# Patient Record
Sex: Male | Born: 1935 | Race: White | Hispanic: No | Marital: Married | Smoking: Former smoker
Health system: Southern US, Community
[De-identification: ages and names within clinical notes are randomized; demographics above are authoritative.]

## PROBLEM LIST (undated history)

## (undated) DIAGNOSIS — C801 Malignant (primary) neoplasm, unspecified: Secondary | ICD-10-CM

## (undated) DIAGNOSIS — I10 Essential (primary) hypertension: Secondary | ICD-10-CM

## (undated) DIAGNOSIS — I509 Heart failure, unspecified: Secondary | ICD-10-CM

## (undated) HISTORY — PX: BACK SURGERY: SHX140

## (undated) HISTORY — PX: LUNG SURGERY: SHX703

---

## 2011-03-22 ENCOUNTER — Inpatient Hospital Stay (HOSPITAL_COMMUNITY)
Admission: EM | Admit: 2011-03-22 | Discharge: 2011-03-23 | Disposition: A | Discharge status: Home / Self Care | Payer: Medicare Other | Source: Home / Self Care | Attending: Internal Medicine | Admitting: Internal Medicine

## 2011-03-22 ENCOUNTER — Emergency Department (HOSPITAL_COMMUNITY): Payer: Medicare Other

## 2011-03-22 DIAGNOSIS — N289 Disorder of kidney and ureter, unspecified: Secondary | ICD-10-CM | POA: Diagnosis not present

## 2011-03-22 DIAGNOSIS — J9620 Acute and chronic respiratory failure, unspecified whether with hypoxia or hypercapnia: Secondary | ICD-10-CM | POA: Diagnosis present

## 2011-03-22 DIAGNOSIS — I2510 Atherosclerotic heart disease of native coronary artery without angina pectoris: Secondary | ICD-10-CM | POA: Diagnosis present

## 2011-03-22 DIAGNOSIS — E119 Type 2 diabetes mellitus without complications: Secondary | ICD-10-CM | POA: Diagnosis present

## 2011-03-22 DIAGNOSIS — I214 Non-ST elevation (NSTEMI) myocardial infarction: Secondary | ICD-10-CM | POA: Diagnosis present

## 2011-03-22 DIAGNOSIS — I2582 Chronic total occlusion of coronary artery: Secondary | ICD-10-CM | POA: Diagnosis present

## 2011-03-22 DIAGNOSIS — I129 Hypertensive chronic kidney disease with stage 1 through stage 4 chronic kidney disease, or unspecified chronic kidney disease: Secondary | ICD-10-CM | POA: Diagnosis present

## 2011-03-22 DIAGNOSIS — Z79899 Other long term (current) drug therapy: Secondary | ICD-10-CM

## 2011-03-22 DIAGNOSIS — Z7982 Long term (current) use of aspirin: Secondary | ICD-10-CM

## 2011-03-22 DIAGNOSIS — F17200 Nicotine dependence, unspecified, uncomplicated: Secondary | ICD-10-CM | POA: Diagnosis present

## 2011-03-22 DIAGNOSIS — I509 Heart failure, unspecified: Secondary | ICD-10-CM | POA: Diagnosis not present

## 2011-03-22 DIAGNOSIS — N189 Chronic kidney disease, unspecified: Secondary | ICD-10-CM | POA: Diagnosis present

## 2011-03-22 DIAGNOSIS — E7800 Pure hypercholesterolemia, unspecified: Secondary | ICD-10-CM | POA: Diagnosis present

## 2011-03-22 DIAGNOSIS — Z8611 Personal history of tuberculosis: Secondary | ICD-10-CM

## 2011-03-22 DIAGNOSIS — I712 Thoracic aortic aneurysm, without rupture, unspecified: Secondary | ICD-10-CM | POA: Diagnosis present

## 2011-03-22 DIAGNOSIS — I5021 Acute systolic (congestive) heart failure: Secondary | ICD-10-CM | POA: Diagnosis not present

## 2011-03-22 DIAGNOSIS — Z9861 Coronary angioplasty status: Secondary | ICD-10-CM

## 2011-03-22 DIAGNOSIS — I059 Rheumatic mitral valve disease, unspecified: Secondary | ICD-10-CM | POA: Diagnosis present

## 2011-03-22 DIAGNOSIS — Z794 Long term (current) use of insulin: Secondary | ICD-10-CM

## 2011-03-22 DIAGNOSIS — E785 Hyperlipidemia, unspecified: Secondary | ICD-10-CM | POA: Diagnosis present

## 2011-03-22 DIAGNOSIS — J441 Chronic obstructive pulmonary disease with (acute) exacerbation: Secondary | ICD-10-CM | POA: Diagnosis present

## 2011-03-22 DIAGNOSIS — IMO0002 Reserved for concepts with insufficient information to code with codable children: Secondary | ICD-10-CM

## 2011-03-22 LAB — CBC
HCT: 36 % — ABNORMAL LOW (ref 39.0–52.0)
MCV: 93.3 fL (ref 78.0–100.0)
RDW: 14.2 % (ref 11.5–15.5)
WBC: 8.4 10*3/uL (ref 4.0–10.5)

## 2011-03-22 LAB — BASIC METABOLIC PANEL
BUN: 19 mg/dL (ref 6–23)
GFR calc non Af Amer: 51 mL/min — ABNORMAL LOW (ref >60)
Glucose, Bld: 334 mg/dL — ABNORMAL HIGH (ref 70–99)
Potassium: 5.2 mEq/L — ABNORMAL HIGH (ref 3.5–5.1)

## 2011-03-22 LAB — DIFFERENTIAL
Eosinophils Relative: 1 % (ref 0–5)
Lymphocytes Relative: 6 % — ABNORMAL LOW (ref 12–46)
Lymphs Abs: 0.5 10*3/uL — ABNORMAL LOW (ref 0.7–4.0)
Monocytes Absolute: 0.4 10*3/uL (ref 0.1–1.0)
Monocytes Relative: 5 % (ref 3–12)

## 2011-03-22 LAB — GLUCOSE, CAPILLARY: Glucose-Capillary: 180 mg/dL — ABNORMAL HIGH (ref 70–99)

## 2011-03-22 LAB — BLOOD GAS, ARTERIAL
Acid-base deficit: 2.7 mmol/L — ABNORMAL HIGH (ref 0.0–2.0)
Bicarbonate: 21.7 mEq/L (ref 20.0–24.0)
O2 Saturation: 92.1 %
pO2, Arterial: 64.2 mmHg — ABNORMAL LOW (ref 80.0–100.0)

## 2011-03-22 MED ORDER — IOHEXOL 300 MG/ML  SOLN
100.0000 mL | Freq: Once | INTRAMUSCULAR | Status: AC | PRN
  Administered 2011-03-22: 100 mL via INTRAVENOUS

## 2011-03-22 NOTE — H&P (Addendum)
NAME:  John Gilbert, RECORE NO.:  [id number]  MEDICAL RECORD NO.:  [id number]           PATIENT TYPE:  I  LOCATION:  1432                         FACILITY:  [facility]  PHYSICIAN:  Vania Rea, M.D. DATE OF BIRTH:  [birth year]  DATE OF ADMISSION:  03/22/2011 DATE OF DISCHARGE:                             HISTORY & PHYSICAL   PRIMARY CARE PROVIDER:  Physician at [facility] in [place], therefore, he is unassigned.  CHIEF COMPLAINT:  Shortness of breath.  HISTORY OF PRESENT ILLNESS:  Mr. Goh is a delightful 77 year old male with a history of COPD, CAD, and diabetes who presents to the [facility] ED from home with a chief complaint of shortness of breath. Information is obtained from the patient and his significant other who is at the bedside.  He indicates that this morning, he awakened with shortness of breath.  He also reports that this happens somewhat frequently and usually when it happens, he will go outside, get some fresh air, uses inhaler and the shortness of breath resolves itself in short order.  He reports that this morning, however, the shortness of breath did not resolve.  The wife reports that as more and more time went by and the shortness of breath did not resolve.  The patient's anxiety level increased and then the shortness of breath worsened.  In addition, the patient reports having a "lingering" cough since December of last year when he had a respiratory infection.  He states that he did use his inhaler this morning without relief.  He denies any chest pain, palpitation, headache, or dizziness.  He denies any recent illness, fever, chills, or recent travel.  He reports that he is sleeping regularly on his side, but is unable to sleep flat on his back.  He indicates that his sputum is thick and clear.  On occasion, he indicates that he checks himself with such vigorous coughing and he may vomit.  He denies any abdominal pain, nausea, vomiting, or  diarrhea.  When symptoms did not resolve this morning, he came to the emergency room.  Upon arrival in the emergency room, his O2 saturation level was in the 80s on room air.  He was given DuoNeb and 60 mg of prednisone with good relief. Symptoms came on suddenly, have persisted and worsened and are characterized as moderate to severe.  We are asked to admit for further evaluation and treatment.  ALLERGIES:  MORPHINE.  PAST MEDICAL HISTORY: 1. Diabetes type 2, on insulin. 2. CAD. 3. Hypertension. 4. Hypercholesterolemia.  PAST SURGICAL HISTORY: 1. Back surgery x3 in 1977, 1993, and 1997. 2. Cardiac stent. 3. Lung resection in 1961.  FAMILY MEDICAL HISTORY:  Mother is deceased.  She died at the age of 75 with cancer of the stomach.  Father deceased at 94.  He had cancer as well.  He believes in the lungs.  The patient is the youngest of 10 siblings, 8 of whom are deceased.  Their collective medical history is positive for MI, stroke, and lung cancer.  SOCIAL HISTORY:  The patient lives with his significant other.  He is a retired [employer].  He smokes a  pack a day and has done so for decades.  He drinks 4 to 5 beers a week, usually on Thursday night when he is at the [facility]  He denies any illicit drug use.  MEDICATIONS:  Dosages are unknown.  Pharmacy tech is reconciling. 1. Atenolol. 2. Glimepiride. 3. Lantus. 4. Lisinopril. 5. Metformin. 6. Albuterol.  REVIEW OF SYSTEMS:  GENERAL:  Negative fever, chills, anorexia, or unintentional weight loss.  ENT:  Negative ear pain, nasal congestion, or sore throat.  CV:  Negative chest pain, palpitations, or lower extremity edema.  RESPIRATORY:  See HPI.  MUSCULOSKELETAL:  Positive for some chronic back pain.  Negative for joint pain or muscle weakness. NEUROLOGIC:  Negative for headache, visual disturbances, numbness or tingling of extremities.  GI:  Negative abdominal pain, nausea, or vomiting.  GUT:  Rare  constipation and melena.  GU:  Negative for dysuria, hematuria, frequency, or urgency.  PSYCHIATRIC:  Negative for depression or anxiety.  HEMATOLOGIC:  Negative for any unusual bruising or bleeding.  LABORATORY DATA:  WBCs 8.4, hemoglobin 11.4, hematocrit 36.0, platelets 231, neutrophils 88%, and absolute neutrophils 7.4.  Sodium 135, potassium 5.2, chloride 101, CO2 of 25, BUN 19, creatinine 1.36, and glucose 334.  Arterial blood gas yields a pH of 7.36, pCO2 of 38.6, pO2 of 64.2, bicarbonate 21.7, and total CO2 is 20.0.  RADIOLOGY:  Chest x-ray yields bibasilar nodularity and airspace disease.  It could represent infection, neoplasm, and chronic scarring. Bilateral pulmonary scarring in the upper and lower lobes anteriorly. Hyperinflated lungs.  PHYSICAL EXAMINATION:  VITAL SIGNS:  Temperature 97.9, blood pressure 168/86, heart rate 101, respirations 22, and saturation 92% on 2 L. GENERAL:  Sitting up in bed, awake, alert, well-nourished, and well- hydrated.  No acute distress. HEAD:  Normocephalic and atraumatic.  Pupils equal, round, and reactive to light.  EOMI.  Mucous membranes of his mouth are pink, slightly dry. No obvious lesion or exudates in his nose or ears. NECK:  Supple.  No JVD.  Full range of motion.  No lymphadenopathy. CV:  Tachycardic, but regular.  No murmur, gallop, or rub.  No lower extremity edema.  Pedal pulses present and palpable. RESPIRATORY:  Mild-to-moderate increased work of breathing.  Breath sounds distant.  Mild expiratory wheeze particularly on the left posterior.  Prolonged expiratory phase. ABDOMEN:  Round and soft, positive bowel sounds throughout, nontender to palpation.  No mass or organomegaly noted. NEUROLOGIC:  Alert and oriented x3.  Speech clear.  Facial symmetry. Cranial nerves II through XII grossly intact. MUSCULOSKELETAL:  Moves all extremities.  No joint swelling/erythema. EXTREMITIES:  Without clubbing or cyanosis.  ASSESSMENT  AND PLAN: 1. Dyspnea secondary to chronic obstructive pulmonary disease     exacerbation.  We will admit to telemetry for observation.  We will     get sputum culture, provide O2 support, monitor saturations.  We     will provide DuoNeb and IV Solu-Medrol and Mucinex.  The patient     seems much improved, but does get short of breath easily with     conversation.  Suspect he will be able to go home tomorrow     particularly if he can maintain the saturations after walking the     hall. 2. Acute respiratory failure secondary to #1.  See     treatment for #1.  Saturations now are 90% on room air.  The     patient received prednisone and DuoNeb in the ED with good results. 3.  Diabetes.  We will get a hemoglobin A1c.  We will continue his     Lantus.  We will use sliding scale glycemic control. 4. Hypertension.  Blood pressure is currently 168/73.  We will     continue his home medications once pharmacy has reconciled.  The     patient takes lisinopril and atenolol. 5. History of coronary artery disease.  Currently, no chest pain.  We     will monitor on Telemetry. 6. Deep venous thrombosis prophylaxis, we will use Lovenox. 7. Code status.  The patient is a full code.  This assessment and plan was discussed with Dr. Orvan Falconer.  It was truly a pleasure taking care of Mr. Hyder.     Gwenyth Bender, NP   ______________________________ Vania Rea, M.D.    KMB/MEDQ  D:  03/22/2011  T:  03/22/2011  Job:  [id number]  Electronically Signed by Vania Rea M.D. on 03/22/2011 07:35:37 PM Electronically Signed by Toya Smothers  on 03/31/2011 04:55:00 PM

## 2011-03-23 ENCOUNTER — Inpatient Hospital Stay (HOSPITAL_COMMUNITY)
Admission: AD | Admit: 2011-03-23 | Discharge: 2011-03-28 | DRG: 280 | Disposition: A | Discharge status: Home Health | Payer: Medicare Other | Source: Other Acute Inpatient Hospital | Attending: Cardiovascular Disease | Admitting: Cardiovascular Disease

## 2011-03-23 DIAGNOSIS — I2510 Atherosclerotic heart disease of native coronary artery without angina pectoris: Secondary | ICD-10-CM

## 2011-03-23 DIAGNOSIS — R0602 Shortness of breath: Secondary | ICD-10-CM

## 2011-03-23 HISTORY — DX: Essential (primary) hypertension: I10

## 2011-03-23 LAB — CARDIAC PANEL(CRET KIN+CKTOT+MB+TROPI)
CK, MB: 89.3 ng/mL (ref 0.3–4.0)
CK, MB: 53.8 ng/mL (ref 0.3–4.0)
Relative Index: 8.7 — ABNORMAL HIGH (ref 0.0–2.5)
Relative Index: 7.9 — ABNORMAL HIGH (ref 0.0–2.5)
Total CK: 712 U/L — ABNORMAL HIGH (ref 7–232)
Troponin I: 21.29 ng/mL (ref 0.00–0.06)
Troponin I: 28.53 ng/mL (ref 0.00–0.06)
Troponin I: 35.84 ng/mL (ref 0.00–0.06)

## 2011-03-23 LAB — BASIC METABOLIC PANEL
BUN: 17 mg/dL (ref 6–23)
Calcium: 9.3 mg/dL (ref 8.4–10.5)
GFR calc non Af Amer: >60 mL/min (ref >60)
Glucose, Bld: 144 mg/dL — ABNORMAL HIGH (ref 70–99)

## 2011-03-23 LAB — CBC
HCT: 32.5 % — ABNORMAL LOW (ref 39.0–52.0)
HCT: 33.3 % — ABNORMAL LOW (ref 39.0–52.0)
Hemoglobin: 10.6 g/dL — ABNORMAL LOW (ref 13.0–17.0)
MCHC: 32.3 g/dL (ref 30.0–36.0)
MCHC: 31.8 g/dL (ref 30.0–36.0)
MCV: 91.8 fL (ref 78.0–100.0)
RBC: 3.57 MIL/uL — ABNORMAL LOW (ref 4.22–5.81)
RDW: 14.1 % (ref 11.5–15.5)

## 2011-03-23 LAB — GLUCOSE, CAPILLARY
Glucose-Capillary: 74 mg/dL (ref 70–99)
Glucose-Capillary: 160 mg/dL — ABNORMAL HIGH (ref 70–99)

## 2011-03-23 LAB — HEPARIN LEVEL (UNFRACTIONATED): Heparin Unfractionated: <0.1 IU/mL — ABNORMAL LOW (ref 0.30–0.70)

## 2011-03-23 NOTE — H&P (Signed)
NAME:  John Gilbert, John Gilbert NO.:  [id number]  MEDICAL RECORD NO.:  [id number]           PATIENT TYPE:  I  LOCATION:  4712                         FACILITY:  [facility]  PHYSICIAN:  Zacarias Pontes, MD       DATE OF BIRTH:  06-May-1934  DATE OF ADMISSION:  03/23/2011 DATE OF DISCHARGE:                             HISTORY & PHYSICAL   PRIMARY CARDIOLOGIST:  Located at the [facility].  PRIMARY CARE PHYSICIAN:  Located at the [facility].  CHIEF COMPLAINT:  Shortness of breath.  HISTORY OF PRESENT ILLNESS:  John Gilbert is a very pleasant 77 year old gentleman with history of ongoing tobacco use, hyperlipidemia, hypertension, diabetes, and known coronary artery disease and multiple PCIs in the past (anatomy unknown) who presented to [facility] yesterday morning with shortness of breath manifesting as an episode far worse than anything he is used to.  His episode of dyspnea began in the morning at home while he was at rest.  Despite use of his home inhalers, it lasted far longer than he was used to and in fact scared him a fair amount.  Given the ongoing nature of his symptoms without relief, he decided to seek medical attention.  He denies chest pain, denies nausea, and denies diaphoresis.  He denies any recent sick contacts.  He denies fevers or cough.  He denies any GI upset.  He denies any orthopnea, PND, or lower extremity swelling.  At [facility] he was treated presumptively for a COPD flare with nebulizers and systemic corticosteroids.  Cardiac biomarkers were checked as part of his workup and they came back as markedly abnormal prompting a transfer to [facility] overnight for further evaluation and management.  PAST MEDICAL HISTORY: 1. Coronary artery disease with several reported prior PCIs.  His     anatomy is unfortunately unknown given that all of his records are     in the [place]  system. 2. COPD. 3. Diabetes mellitus. 4. Hypertension. 5. Hypercholesterolemia. 6. Multiple back surgeries. 7. Reported carotid artery disease.  SOCIAL HISTORY:  He is a retired [employer].  He smokes one pack per day and has done so for many years.  He drinks 4-5 beers weekly.  He is not married but does have a girlfriend.  FAMILY HISTORY:  Noncontributory to this admission.  REVIEW OF SYSTEMS:  As per HPI, otherwise comprehensively negative.  ALLERGIES:  MORPHINE.  MEDICATIONS: 1. Atenolol 25 mg daily. 2. Lisinopril 40 mg daily. 3. Lantus 5-10 units nightly.  He rarely administers himself 10 units     given an experience of hypoglycemia in the past. 4. Aspirin 325 mg daily. 5. Albuterol inhaler as needed. 6. Omeprazole 10 mg daily. 7. Rosuvastatin 10 mg daily. 8. Combivent 2 puffs b.i.d. 9. Glyburide 5 mg daily. 10.Tramadol as needed.  PHYSICAL EXAM:  A comprehensive cardiovascular exam was performed. VITAL SIGNS:  The patient is afebrile.  The heart rate in the 80s with a blood pressure of 140/80, breathing 16 times per minute and satting 96% on 2 liters nasal cannula. HEENT:  Notable for a supple neck with  no masses or lymphadenopathy. JVP was elevated to the mid neck at 45 degrees. CARDIAC:  Notable for normal S1-S2 with no murmurs, rubs, or detectable gallops. LUNGS:  Notable for crackles in the bilateral lower half of the lung fields with end-expiratory wheezing heard throughout. ABDOMEN:  Notable for a soft, nontender, nondistended belly with positive bowel sounds and no abdominal bruits. EXTREMITIES:  Warm and well perfused with no lower extremity edema. NEUROLOGIC:  Notable for gentleman who is alert and oriented x3 with no focal neurologic deficits detected.  LABORATORY EVALUATION:  White count is 8000, hematocrit 32, platelets 204,000.  Sodium of 138, potassium 4.4, chloride 105, bicarb 26, BUN 17, creatinine 1.1 with a glucose of 144.  Initial set of  cardiac enzymes performed at 11:30 p.m. on April 24 at [facility] were notable for a CK of 973, a CK-MB of 99, an index of 10, and a troponin of 28.53.  EKG here demonstrates normal sinus rhythm with T-wave inversions in V4 through V6 with no ST-segment deviation appreciated.  IMPRESSION:  This is a 77 year old gentleman with dyspnea likely representing an anginal equivalent in the setting of an non-ST elevation myocardial infarction with markedly positive biomarkers.  Given the degree of his biomarker elevation, I suspect that his event occurred earlier in the day yesterday.  It will be important to follow his symptoms closely.  Of note, he is currently asymptomatic and he is breathing comfortably.  The overall trajectory of his enzyme curve will be important.  PLAN:  We will continue IV heparin as well as beta blockade, statin therapy, aspirin therapy, and ACE inhibition in the form of lisinopril. We will similarly continue cardiac enzyme cycling and serial ECGs to be done daily. ECGs should also be done should he manifest any new dyspnea symptoms while in the hospital, with a goal of localizing his ischemia. At no point in his presentation has he complained of chest pain, thus I suspect that dyspnea has been his anginal equivalent.  I made him n.p.o. for possible invasive angiography and percutaneous coronary intervention.  His old records outlining his coronary anatomy would be helpful in guiding therapeutic decisions going forward.  The [facility] may be able to provide these.  We will also obtain an echocardiogram to formally define his ejection fraction and to assess for any wall motion abnormalities.  Optimal interpretation of his echo results will occur in the context of his old records.  From a pulmonary standpoint, we will continue inhaled corticosteroids and beta agonist therapy.  I favor holding his systemic corticosteroid given its potentially deleterious impact on  infarct size. His dyspnea may very well be all or primarily due to an MI.  I explained the plan to the patient and answered his questions to the best of my ability.          ______________________________ Zacarias Pontes, MD     DM/MEDQ  D:  03/23/2011  T:  03/23/2011  Job:  [id number]  Electronically Signed by Zacarias Pontes MD on 03/23/2011 09:06:54 AM

## 2011-03-24 ENCOUNTER — Inpatient Hospital Stay (HOSPITAL_COMMUNITY): Payer: Medicare Other

## 2011-03-24 DIAGNOSIS — I359 Nonrheumatic aortic valve disorder, unspecified: Secondary | ICD-10-CM

## 2011-03-24 LAB — GLUCOSE, CAPILLARY
Glucose-Capillary: 87 mg/dL (ref 70–99)
Glucose-Capillary: 432 mg/dL — ABNORMAL HIGH (ref 70–99)
Glucose-Capillary: 100 mg/dL — ABNORMAL HIGH (ref 70–99)

## 2011-03-24 LAB — BASIC METABOLIC PANEL
BUN: 27 mg/dL — ABNORMAL HIGH (ref 6–23)
CO2: 29 mEq/L (ref 19–32)
Calcium: 9.4 mg/dL (ref 8.4–10.5)
Chloride: 101 mEq/L (ref 96–112)
Creatinine, Ser: 1.54 mg/dL — ABNORMAL HIGH (ref 0.4–1.5)
GFR calc Af Amer: 54 mL/min — ABNORMAL LOW (ref >60)
Glucose, Bld: 116 mg/dL — ABNORMAL HIGH (ref 70–99)

## 2011-03-24 NOTE — Cardiovascular Report (Signed)
NAME:  John Gilbert, John Gilbert           ACCOUNT NO.:  [id number]  MEDICAL RECORD NO.:  [id number]           PATIENT TYPE:  I  LOCATION:  4712                         FACILITY:  [facility]  PHYSICIAN:  Lorine Bears, MD     DATE OF BIRTH:  06/24/1934  DATE OF PROCEDURE: DATE OF DISCHARGE:                           CARDIAC CATHETERIZATION   PROCEDURES PERFORMED: 1. Left heart catheterization 2. Coronary angiography.  INDICATIONS AND CLINICAL HISTORY:  This is a 77 year old gentleman with known history of coronary artery disease status post multiple angioplasty and stent placement most recently in 2008, all done at an outside hospital.  He also has history of COPD and active tobacco use as well as type 2 diabetes, hypertension and hyperlipidemia.  He presented with increased dyspnea and chest discomfort.  He was found to have non- ST-elevation myocardial infarction with troponin level of 35.  He was also treated for COPD.  Due to this presentation, cardiac catheterization was recommended.  Risks, benefits and alternatives were discussed with the patient.  Access is right radial artery.  STUDY DETAILS:  A standard informed consent was obtained.  The right radial area was prepped in a sterile fashion.  This was anesthetized with 1% lidocaine.  A 5-French sheath was placed in the right radial artery; 3 mg of verapamil was given through the sheath, 5000 units of unfractioned heparin was given intravenously.  Initially used a Jackie catheter and was able to engage the left anterior descending artery which had a separate ostium from the left circumflex.  This catheter could not engage the right coronary artery or the ostium of the left circumflex.  Thus it was exchanged to right Judkins catheter which successfully intubated the right coronary artery.  It was very difficult to engage the left circumflex as it had a separate ostium from the left anterior descending artery.  I initially used a  JL-3.5 catheter, but was unsuccessful.  This was exchanged to a JL-4 catheter which was able to engage the vessel but there was significant pressure dampening.  I then used an AL-1 catheter and was able to engage the artery.  All catheter exchanges were done over the wire.  Left ventricular angiography was not performed due to severely elevated left ventricular end-diastolic pressure as well as contrast load.  At the end of the procedure the sheath was removed, TR band was applied.  There was no immediate complications.  STUDY FINDINGS:  Hemodynamic findings:  Left ventricular pressure is 136/25 with a left ventricular end-diastolic pressure of 42 mmHg. Aortic pressure is 138/82 with a mean pressure of 107 mmHg.  Left ventricular angiography.  This was not performed.  CORONARY ANGIOGRAPHY:    Left main coronary artery is absent LAD and the left circumflex have 2 separate ostium.  Left anterior descending artery:  The vessel was normal in size and wraps around the apex.  It has moderate to severe calcification especially in the mid segment.  There was slight pressure ventricularization with engagement.  There is a 40% ostial stenosis. There is a 30% diffuse disease proximally.  In the mid segment right at large diagonal branch there is a 54  tubular stenosis.  The rest of the LAD has minor irregularities.  The first diagonal is a small-sized branch with 40% ostial stenosis.  Second diagonal is a large-sized vessel with 50% ostial stenosis.  Left circumflex artery:  This originates with a separate ostium.  There was significant pressure dampening with catheter engagement.  There is a 70%-80 ostial stenosis.  A stent is noted proximally which is patent with mild in-stent restenosis.  The rest of the circumflex has minor irregularities.  OM-1 is a small-sized branch.  OM-2 is a large-sized branch with proximal stent noted.  The stent is patent with 50-60% ostial in-stent  restenosis.  Right coronary artery:  The vessel is occluded proximally.  There are collaterals coming from the distal LAD to the right coronary artery all the way back to the mid vessel.  STUDY CONCLUSIONS: 1. Significant three-vessel coronary artery disease with unclear     culprit for non-ST-elevation myocardial infarction. 2. Severely elevated left ventricular end-diastolic pressure. 3. Occluded right coronary artery which seems to be chronic with     collaterals coming from the left anterior descending artery.  RECOMMENDATIONS: 1. Recommend a transthoracic echocardiogram to evaluate ejection     fraction as left ventricular angiography was not performed.  Left     ventricular end-diastolic pressure was severely elevated. 2. Recommend medical management of coronary artery disease and     congestive heart failure with active diuresis. 3. I think that the disease in left circumflex artery is the most significant especially ostial stenosis. Consider LCX PCI in few days once he is medically well tuned up. A femoral approach would be recomended due to difficult engagement of LCX. The  ostial lesion will need to be treated first before deciding if the restenosis in OM2 stent is significant. I discussed this complex case with Dr. Excell Seltzer.      Lorine Bears, MD     MA/MEDQ  D:  03/23/2011  T:  03/24/2011  Job:  [id number]  Electronically Signed by Lorine Bears MD on 03/24/2011 09:17:48 AM

## 2011-03-25 ENCOUNTER — Inpatient Hospital Stay (HOSPITAL_COMMUNITY): Payer: Medicare Other

## 2011-03-25 ENCOUNTER — Other Ambulatory Visit (HOSPITAL_COMMUNITY): Payer: Medicare Other

## 2011-03-25 DIAGNOSIS — I714 Abdominal aortic aneurysm, without rupture, unspecified: Secondary | ICD-10-CM

## 2011-03-25 LAB — BASIC METABOLIC PANEL
BUN: 35 mg/dL — ABNORMAL HIGH (ref 6–23)
CO2: 26 mEq/L (ref 19–32)
Chloride: 99 mEq/L (ref 96–112)
Chloride: 100 mEq/L (ref 96–112)
GFR calc non Af Amer: 44 mL/min — ABNORMAL LOW (ref >60)
Glucose, Bld: 240 mg/dL — ABNORMAL HIGH (ref 70–99)
Potassium: 4.3 mEq/L (ref 3.5–5.1)
Potassium: 4.5 mEq/L (ref 3.5–5.1)
Sodium: 132 mEq/L — ABNORMAL LOW (ref 135–145)

## 2011-03-25 LAB — GLUCOSE, CAPILLARY
Glucose-Capillary: 124 mg/dL — ABNORMAL HIGH (ref 70–99)
Glucose-Capillary: 151 mg/dL — ABNORMAL HIGH (ref 70–99)

## 2011-03-25 LAB — BRAIN NATRIURETIC PEPTIDE: Pro B Natriuretic peptide (BNP): 1067 pg/mL — ABNORMAL HIGH (ref 0.0–100.0)

## 2011-03-26 LAB — GLUCOSE, CAPILLARY
Glucose-Capillary: 253 mg/dL — ABNORMAL HIGH (ref 70–99)
Glucose-Capillary: 240 mg/dL — ABNORMAL HIGH (ref 70–99)

## 2011-03-26 LAB — BASIC METABOLIC PANEL
Chloride: 102 mEq/L (ref 96–112)
GFR calc Af Amer: 55 mL/min — ABNORMAL LOW (ref >60)
Potassium: 4.4 mEq/L (ref 3.5–5.1)

## 2011-03-27 ENCOUNTER — Encounter (HOSPITAL_COMMUNITY): Payer: Self-pay | Admitting: Radiology

## 2011-03-27 ENCOUNTER — Inpatient Hospital Stay (HOSPITAL_COMMUNITY): Payer: Medicare Other

## 2011-03-27 LAB — BASIC METABOLIC PANEL
BUN: 27 mg/dL — ABNORMAL HIGH (ref 6–23)
CO2: 30 mEq/L (ref 19–32)
Chloride: 103 mEq/L (ref 96–112)
Creatinine, Ser: 1.47 mg/dL (ref 0.4–1.5)
Glucose, Bld: 95 mg/dL (ref 70–99)
Potassium: 4 mEq/L (ref 3.5–5.1)

## 2011-03-27 LAB — GLUCOSE, CAPILLARY
Glucose-Capillary: 350 mg/dL — ABNORMAL HIGH (ref 70–99)
Glucose-Capillary: 175 mg/dL — ABNORMAL HIGH (ref 70–99)

## 2011-03-27 MED ORDER — IOHEXOL 300 MG/ML  SOLN
80.0000 mL | Freq: Once | INTRAMUSCULAR | Status: AC | PRN
  Administered 2011-03-27: 80 mL via INTRAVENOUS

## 2011-03-28 DIAGNOSIS — I214 Non-ST elevation (NSTEMI) myocardial infarction: Secondary | ICD-10-CM

## 2011-03-28 DIAGNOSIS — I714 Abdominal aortic aneurysm, without rupture: Secondary | ICD-10-CM

## 2011-03-28 LAB — BASIC METABOLIC PANEL
BUN: 24 mg/dL — ABNORMAL HIGH (ref 6–23)
CO2: 30 mEq/L (ref 19–32)
Calcium: 9.2 mg/dL (ref 8.4–10.5)
GFR calc non Af Amer: 55 mL/min — ABNORMAL LOW (ref >60)
Glucose, Bld: 88 mg/dL (ref 70–99)
Sodium: 141 mEq/L (ref 135–145)

## 2011-04-04 NOTE — Consult Note (Signed)
NAME:  John Gilbert, John Gilbert NO.:  [id number]  MEDICAL RECORD NO.:  [id number]           PATIENT TYPE:  LOCATION:                                 FACILITY:  PHYSICIAN:  Charles E. Fields, MD  DATE OF BIRTH:  10-27-1934  DATE OF CONSULTATION:  03/25/2011 DATE OF DISCHARGE:                                CONSULTATION   CHIEF COMPLAINT:  Aortic penetrating ulcer.  HISTORY OF PRESENT ILLNESS:  The patient is a 77 year old male admitted for evaluation of shortness of breath.  He was noted on admission CT scan dedicated for pulmonary embolus to have a penetrating ulcer in his aortic arch.  The patient was also noted to have elevation of his cardiac enzymes at admission.  He subsequently underwent cardiac catheterization by Dr. Elease Hashimoto which showed basically mild coronary artery disease and no real explanation for his cardiac enzyme bump.  The patient does have a known history of what sounds like a prior abdominal aneurysm stent graft repair in [place] in 2008, he goes for routine followup for this.  He denies any chest pain or new back pain currently. He has chronic back pain that has been present for greater than 40 years from the urine.  PAST MEDICAL HISTORY: 1. Coronary artery disease. 2. Diabetes. 3. Hypertension. 4. Elevated cholesterol. These problems are currently controlled and followed at [facility].  PAST SURGICAL HISTORY: 1. Left lower lobe lung resection for tuberculosis. 2. Possible aneurysm stent graft repair. 3. Back surgery.  SOCIAL HISTORY:  He lives with his wife.  He quit smoking 1 week ago.  FAMILY HISTORY:  His mother and father both died of cancer.  REVIEW OF SYSTEMS:  He has had no change in his history and physical since March 22, 2011, history and physical.  PHYSICAL EXAMINATION:  VITAL SIGNS:  Temperature is 98.5, heart rate is 70, respirations 18, blood pressure 99/60, oxygen saturations 97% on 2 liters. GENERAL:  A white male,  in no acute distress, alert, oriented x3. HEENT:  Unremarkable. NECK:  A 2+ carotid pulses without bruits. CHEST:  Clear to auscultation. CARDIAC:  Regular rate and rhythm without murmur. ABDOMEN:  Soft, nontender, nondistended.  No masses. EXTREMITIES:  He has 2+ femoral pulses bilaterally.  He has an absent right radial pulse, he has 2+ left radial pulse, he has 1-2+ right brachial pulse.  Extremities have no significant edema. MUSCULOSKELETAL:  No obvious major joint deformities. NEUROLOGIC:  Upper extremity and lower extremity motor strength is 5/5 and symmetric bilaterally. SKIN:  No open ulcers or rashes.  LABORATORY DATA:  Creatinine 1.6 today, hemoglobin 10.  PE CT was reviewed which shows a penetrating, ulcer just distal to the takeoff of the left subclavian artery.  This is underfilled with contrast due to timing.  ASSESSMENT:  Penetrating ulcer of the aortic arch which is most likely asymptomatic.  We will evaluate further with dedicated CT angiogram when the patient's creatinine has increased.  My partner, Dr. Johny Drilling, will beavailable this weekend as there are further questions.  All these findings were discussed with the patient today.     Janetta Hora. Darrick Penna, MD  CEF/MEDQ  D:  03/25/2011  T:  03/25/2011  Job:  [id number]  cc:   Vesta Mixer, M.D.  Electronically Signed by Fabienne Bruns MD on 04/04/2011 07:58:24 AM

## 2011-04-29 NOTE — Discharge Summary (Signed)
NAME:  John Gilbert, John Gilbert NO.:  [id number]  MEDICAL RECORD NO.:  [id number]           PATIENT TYPE:  I  LOCATION:  4712                         FACILITY:  [facility]  PHYSICIAN:  John Gilbert, M.D. DATE OF BIRTH:  05-03-1934  DATE OF ADMISSION:  03/23/2011 DATE OF DISCHARGE:  03/28/2011                              DISCHARGE SUMMARY   PRIMARY CARDIOLOGIST:  Dr. Orvan Gilbert at the [facility].  PRIMARY CARE PHYSICIAN:  Located at [facility] "Dr. Seth Gilbert" (full name unavailable currently).  DISCHARGE DIAGNOSES: 1. Non-ST-elevation myocardial infarction (likely subendocardial     myocardial infarction).     a.     Cardiac catheterization, March 24, 2011:  Triple-vessel      coronary artery disease with unclear culprit for non-ST elevation      myocardial infarction.  Severely elevated left ventricular end-      diastolic pressure.  Occluded right coronary artery, likely   chronic, collaterals from left anterior descending. 2. Acute-on-chronic chronic obstructive pulmonary disease.     a.     Two days left steroid taper outpatient with significantly      improved dyspnea. 3. Stable transverse aorta, 17-mm ulcerated plaque versus penetrating     ulcer in transverse aorta.     a.     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     b.     CT angiography of the chest, March 27, 2011:  Stable      transverse aorta, 17-mm ulcerated plaque versus penetrating ulcer.     c.     The patient not interested in surgery, Surgery consulted,      but final recommendations not received prior to discharge given      the patient being asymptomatic and not interested in surgical      intervention. 4. Acute-on-chronic renal insufficiency.     a.     Creatinine on admission 1.36, peak creatinine 1.55 with peak      BUN of 36, BUN and  creatinine 24 and 1.27 respectively on      discharge. 5. Acute systolic congestive heart failure.     a.     BNP 1067 on March 25, 2011 (no prior value available, not      rechecked).  Mild aortic insufficiency.  Moderate mitral      regurgitation.  Moderately dilated left atrium.  PA peak pressure      33 mmHg.     b.     2-D echocardiogram, March 24, 2011:  Left ventricular cavity      size moderately dilated, moderate left ventricular ejection      fraction 35%- 40%. 6. Mitral regurgitation, moderate (per 2-D echo at this admission). 7. Bilateral pleural effusions (right greater than left with prominent     interstitial markings suggestive of interstitial edema).     a.     Noted on CT March 22, 2011. 8. Ongoing tobacco abuse.  a.     The patient committed to quitting entirely after extensive      education/instruction from [facility].  SECONDARY DIAGNOSES: 1. Insulin-dependent diabetes mellitus. 2. Hypertension. 3. Hypercholesterolemia. 4. Coronary artery disease.     a.     Per patient history of stent? type/location.  PAST SURGICAL HISTORY: 1. History of back surgery x3, 1977, 1983, 1997. 2. Lung resection, 1961.  ALLERGIES AND INTOLERANCES:  MORPHINE SULFATE (tachy palpitations, "makes me crazy").  PROCEDURES: 1. Chest x-ray, March 22, 2011:  Bibasilar nodularity and airspace     disease (? infection, neoplasm, chronic scarring).  Bilateral     pleural scarring in upper and lower lobe essentially.     Hyperinflated lungs. 2. CT angio of the chest, March 22, 2011:  No evidence of acute PE.     Significant atheromatous changes throughout the aortic arch and     descending thoracic aorta.  Small (17 mm in diameter) primarily     thrombosed aneurysm emanating from the aortic arch to left midline.     Bilateral pleural effusions, right greater than left with prominent     interstitial markings suggestive of interstitial edema. 3. CT abdomen/pelvis. 4. EKG,  March 23, 2011, normal sinus rhythm with T-wave inversion at     V4-V6 with no ST-segment deviation noted. 5. Cardiac catheterization, March 24, 2011:  LM absent.  LAD and left     circumflex had two separate ostium.  LAD normal in size, wraps to     the apex, moderate severe calcification especially in the mid     segment, 40% ostial stenosis, 30% diffuse disease proximally, mid     segment right at large diagonal branch 60% tubular stenosis, and     the rest had minor irregularities.  D1 small with 40% ostial     stenosis, D2 large with 50% ostial stenosis.  Left circumflex 70%-     80% ostial stenosis, stents noted proximally, which is patent with     mild in-stent restenosis, rest of circumflex had minor     irregularities.  OM1 small.  OM2 large size with proximal stent     noted, patent with 50%-60% ostial in-stent restenosis.  RCA     occluded proximally.  Collaterals coming from distal LAD all the     way back to the mid vessel. 6. 2-D echocardiogram, March 24, 2011:  Please see full report under     acute systolic CHF. 7. CT angio of the chest, March 27, 2011:  Extensive thoracic aortic     and branch vessel atherosclerotic plaque formation.  Stable     transverse aorta, 17-mm ulcerated plaque versus penetrating ulcer.     Proximal left subclavian atherosclerotic shallow ulceration.     Subclavian arteries remained patent without evidence of occlusion,     significant stenosis or dissection.  Bilateral pleural effusions     with compressive basilar atelectasis and lower lobe asymmetric     interstitial edema.  HISTORY OF PRESENT ILLNESS:  John Gilbert is a 77 year old Caucasian gentleman with the above-noted complex medical history, who initially presented to [facility] with complaints of shortness of breath.  The patient is in his usual state of health until December of last year when he started noticing cough.  This lingered until the present by the shortness of  breath.  This only become significantly worse over the past week or so.  There is significant worsening on the morning of presentation,  and when he used his inhaler, he had no significant relief.  He denied chest pain, palpitations, headache, dizziness.  He denied any recent illness, fever/chills, or recent travel.  He does note orthopnea.  His cough is productive with thick clear sputum.  He has had vomiting with coughing only "on occasion."  He, however, denies abdominal pain, nausea/vomiting, diarrhea.  As his symptoms were not improving including with his inhaler, he presented for further eval to [facility].  At [facility] ED, he was given DuoNeb and 60 mg of prednisone with good relief.  The patient notes that his symptoms significantly worsened in severity on the date of presentation.  HOSPITAL COURSE:  The patient was admitted initially by the Hospital Service and was noted to have elevated cardiac enzymes and Cardiology was consulted later to cover the patient's care.  He had cardiac catheterization as described above.  Medical management has been planned based on those findings.  He was also had incidental finding of the 17- mm "ulcerated plaque versus penetrating ulcer."  Surgery was consulted, but requested another CT angio of the chest before making final recommendations.  As the patient is asymptomatic and refusing any surgical intervention, final records from Surgery post repeat CT angio have not been obtained.  The patient wishes to follow up with his primary care physician and primary cardiologist.  It also noteworthy that the patient had significantly elevated BNP and depressed LV function on his 2-D echocardiogram.  However, his weight actually increased at this admission and his symptoms of dyspnea significantly improved with COPD treatments.  He also received IV fluids post cath for mild creatinine bump and in spite of this shortness of breath continued to  improve.  Given his findings of elevated BNP, depressed systolic function, elevated filling pressures, the patient is being discharged with home Lasix.  He will have a basic metabolic panel checked at his primary care office in 1 week (the patient is to call and set this up). The importance of this lab was stressed to the patient and he indicates that he has had no problems diagnosed, lab checkup himself, and prefers to do so.  The patient is unsure of his primary care physician's full name and therefore I will be contacted later by the patient to ensure that this physician gets a copy of this discharge summary.  The hospitalist continue to follow the patient during his hospital stay and followed his renal function and diabetes as well as COPD treatments.  He will finish his prednisone taper with 20 mg of prednisone daily for 2 days as an outpatient and then will discontinue.  Otherwise, please see discharge meds for final plan for his medical therapy.  At the time of discharge, the patient did receive his new medication list, prescriptions, follow-up instructions, and post-cath instructions.  All questions and concerns were addressed prior to leaving the hospital.  DISCHARGE LABS:  WBC is 8.7, HGB 10.5, HCT 32.5, PLT count 204.  WBC differential on admission notable for neutrophils at 80%, lymphocytes at 6%, absolute lymphocytes 0.5%, otherwise within normal limits.  Protime 14.4, INR 1.10, blood glucose ranged from 75-350 the last 48 hours of admission (also indicative of full hospital course).  Sodium 141, potassium 4.6, chloride 104, bicarb 30, BUN 24, creatinine 1.27, calcium 9.2.  First full set of cardiac enzymes, CK 973, MB 99.3, relative index 10.2, troponin-I 28.53.  Second full set, CK 1029, MB 89.3, relative index 8.7, troponin 35.84.  Third full set, CK  937, MB 74.2 with relative index of 7.9 and troponin-I of 29.25.  Final set, CK 712, CK-MB 53.8 with relative index 7.6 and  troponin of 21.29.  BNP 1067.  FOLLOWUP PLANS AND APPOINTMENTS: 1. Basic metabolic panel to be drawn at primary care office in 1 week     (the patient is to set up). 2. Primary care physician, 2-3 weeks. 3. Dr, John Gilbert at the [facility] as previously     scheduled, approximately 3 weeks.  DISCHARGE MEDICATIONS: 1. Acetaminophen 325 mg 1-2 tablets p.o. q.4 h. p.r.n. 2. Furosemide 40 mg p.o. b.i.d. 3. Sublingual nitroglycerin 0.4 mg 1 tablet q.5 minutes up to 3 doses     p.r.n. for chest discomfort (do not take if systolic blood pressure     less than 100, always check first). 4. Potassium chloride 10 mEq p.o. b.i.d. with meals. 5. Prednisone 20 mg 1 tablet daily with meal (to be taken on May 1 and     May 2). 6. Enteric-coated aspirin 325 mg p.o. daily. 7. Atenolol 25 mg 1 tablet p.o. daily. 8. Combivent 2 puffs q.4 h. p.r.n. 9. Crestor 20 mg one half tablet p.o. daily. 10.Glyburide 5 mg 2 tablets p.o. b.i.d. 11.Lantus insulin 5-11 units sliding scale subcu injection nightly     p.r.n. 12.Lisinopril 40 mg 1 tablet p.o. daily. 13.Metformin 1 gram p.o. b.i.d. 14.Omeprazole 10 mg 1 capsule p.o. daily. 15.Tramadol 50 mg 2 tablets p.o. b.i.d. p.r.n.  DURATION OF DISCHARGE ENCOUNTER:  Including physician time was 45 minutes.     Jarrett Ables, PAC   ______________________________ John Gilbert, M.D.    MS/MEDQ  D:  03/28/2011  T:  03/28/2011  Job:  [id number]  cc:   Dr. Orvan Gilbert  Electronically Signed by Jarrett Ables PAC on 04/06/2011 01:18:20 PM Electronically Signed by Kristeen Miss M.D. on 04/29/2011 04:45:56 PM

## 2014-12-26 ENCOUNTER — Inpatient Hospital Stay (HOSPITAL_COMMUNITY)
Admission: EM | Admit: 2014-12-26 | Discharge: 2014-12-27 | DRG: 193 | Disposition: A | Discharge status: Home / Self Care | Payer: Medicare Other | Attending: Internal Medicine | Admitting: Internal Medicine

## 2014-12-26 ENCOUNTER — Encounter (HOSPITAL_COMMUNITY): Payer: Self-pay | Admitting: Neurology

## 2014-12-26 ENCOUNTER — Emergency Department (HOSPITAL_COMMUNITY): Payer: Medicare Other

## 2014-12-26 DIAGNOSIS — E119 Type 2 diabetes mellitus without complications: Secondary | ICD-10-CM

## 2014-12-26 DIAGNOSIS — I10 Essential (primary) hypertension: Secondary | ICD-10-CM | POA: Diagnosis present

## 2014-12-26 DIAGNOSIS — Z794 Long term (current) use of insulin: Secondary | ICD-10-CM

## 2014-12-26 DIAGNOSIS — T380X5A Adverse effect of glucocorticoids and synthetic analogues, initial encounter: Secondary | ICD-10-CM | POA: Diagnosis present

## 2014-12-26 DIAGNOSIS — E0969 Drug or chemical induced diabetes mellitus with other specified complication: Secondary | ICD-10-CM | POA: Diagnosis present

## 2014-12-26 DIAGNOSIS — J449 Chronic obstructive pulmonary disease, unspecified: Secondary | ICD-10-CM | POA: Diagnosis present

## 2014-12-26 DIAGNOSIS — J441 Chronic obstructive pulmonary disease with (acute) exacerbation: Secondary | ICD-10-CM | POA: Diagnosis present

## 2014-12-26 DIAGNOSIS — Z885 Allergy status to narcotic agent status: Secondary | ICD-10-CM

## 2014-12-26 DIAGNOSIS — Z95810 Presence of automatic (implantable) cardiac defibrillator: Secondary | ICD-10-CM

## 2014-12-26 DIAGNOSIS — Z87891 Personal history of nicotine dependence: Secondary | ICD-10-CM

## 2014-12-26 DIAGNOSIS — I2510 Atherosclerotic heart disease of native coronary artery without angina pectoris: Secondary | ICD-10-CM | POA: Diagnosis present

## 2014-12-26 DIAGNOSIS — R0602 Shortness of breath: Secondary | ICD-10-CM | POA: Diagnosis not present

## 2014-12-26 DIAGNOSIS — Z888 Allergy status to other drugs, medicaments and biological substances status: Secondary | ICD-10-CM

## 2014-12-26 DIAGNOSIS — Z923 Personal history of irradiation: Secondary | ICD-10-CM

## 2014-12-26 DIAGNOSIS — Z85118 Personal history of other malignant neoplasm of bronchus and lung: Secondary | ICD-10-CM | POA: Diagnosis not present

## 2014-12-26 DIAGNOSIS — I340 Nonrheumatic mitral (valve) insufficiency: Secondary | ICD-10-CM | POA: Diagnosis present

## 2014-12-26 DIAGNOSIS — J189 Pneumonia, unspecified organism: Principal | ICD-10-CM | POA: Diagnosis present

## 2014-12-26 DIAGNOSIS — I5023 Acute on chronic systolic (congestive) heart failure: Secondary | ICD-10-CM | POA: Diagnosis present

## 2014-12-26 DIAGNOSIS — I255 Ischemic cardiomyopathy: Secondary | ICD-10-CM | POA: Diagnosis present

## 2014-12-26 DIAGNOSIS — I313 Pericardial effusion (noninflammatory): Secondary | ICD-10-CM | POA: Diagnosis present

## 2014-12-26 DIAGNOSIS — I509 Heart failure, unspecified: Secondary | ICD-10-CM | POA: Insufficient documentation

## 2014-12-26 DIAGNOSIS — J90 Pleural effusion, not elsewhere classified: Secondary | ICD-10-CM | POA: Diagnosis present

## 2014-12-26 DIAGNOSIS — C3412 Malignant neoplasm of upper lobe, left bronchus or lung: Secondary | ICD-10-CM

## 2014-12-26 DIAGNOSIS — I5043 Acute on chronic combined systolic (congestive) and diastolic (congestive) heart failure: Secondary | ICD-10-CM | POA: Diagnosis present

## 2014-12-26 DIAGNOSIS — C3490 Malignant neoplasm of unspecified part of unspecified bronchus or lung: Secondary | ICD-10-CM | POA: Diagnosis present

## 2014-12-26 DIAGNOSIS — Z9221 Personal history of antineoplastic chemotherapy: Secondary | ICD-10-CM | POA: Diagnosis not present

## 2014-12-26 HISTORY — DX: Heart failure, unspecified: I50.9

## 2014-12-26 HISTORY — DX: Malignant (primary) neoplasm, unspecified: C80.1

## 2014-12-26 LAB — BASIC METABOLIC PANEL
Anion gap: 3 — ABNORMAL LOW (ref 5–15)
BUN: 19 mg/dL (ref 6–23)
CALCIUM: 9.7 mg/dL (ref 8.4–10.5)
CO2: 31 mmol/L (ref 19–32)
Chloride: 100 mmol/L (ref 96–112)
Creatinine, Ser: 1.17 mg/dL (ref 0.50–1.35)
GFR calc non Af Amer: 58 mL/min — ABNORMAL LOW (ref >90)
GFR, EST AFRICAN AMERICAN: 67 mL/min — AB (ref >90)
GLUCOSE: 120 mg/dL — AB (ref 70–99)
POTASSIUM: 4.4 mmol/L (ref 3.5–5.1)
SODIUM: 134 mmol/L — AB (ref 135–145)

## 2014-12-26 LAB — CBC
HCT: 39.5 % (ref 39.0–52.0)
Hemoglobin: 12.8 g/dL — ABNORMAL LOW (ref 13.0–17.0)
MCH: 29.6 pg (ref 26.0–34.0)
MCHC: 32.4 g/dL (ref 30.0–36.0)
MCV: 91.4 fL (ref 78.0–100.0)
Platelets: 164 10*3/uL (ref 150–400)
RBC: 4.32 MIL/uL (ref 4.22–5.81)
RDW: 15.2 % (ref 11.5–15.5)
WBC: 6.8 10*3/uL (ref 4.0–10.5)

## 2014-12-26 LAB — GLUCOSE, CAPILLARY
Glucose-Capillary: 331 mg/dL — ABNORMAL HIGH (ref 70–99)
Glucose-Capillary: 324 mg/dL — ABNORMAL HIGH (ref 70–99)

## 2014-12-26 LAB — I-STAT TROPONIN, ED: Troponin i, poc: 0.06 ng/mL (ref 0.00–0.08)

## 2014-12-26 LAB — BRAIN NATRIURETIC PEPTIDE: B NATRIURETIC PEPTIDE 5: 2995.6 pg/mL — AB (ref 0.0–100.0)

## 2014-12-26 MED ORDER — ENOXAPARIN SODIUM 40 MG/0.4ML ~~LOC~~ SOLN
40.0000 mg | SUBCUTANEOUS | Status: DC
  Filled 2014-12-26 (×2): qty 0.4

## 2014-12-26 MED ORDER — ACETAMINOPHEN 325 MG PO TABS: 650.0000 mg | ORAL_TABLET | Freq: Four times a day (QID) | ORAL | Status: DC | PRN

## 2014-12-26 MED ORDER — PANTOPRAZOLE SODIUM 40 MG PO TBEC
40.0000 mg | DELAYED_RELEASE_TABLET | Freq: Every day | ORAL | Status: DC
Start: 2014-12-26 — End: 2014-12-27
  Administered 2014-12-27 (×2): 40 mg via ORAL
  Filled 2014-12-26 (×2): qty 1

## 2014-12-26 MED ORDER — ACETAMINOPHEN 650 MG RE SUPP: 650.0000 mg | Freq: Four times a day (QID) | RECTAL | Status: DC | PRN

## 2014-12-26 MED ORDER — FUROSEMIDE 10 MG/ML IJ SOLN
40.0000 mg | Freq: Once | INTRAMUSCULAR | Status: AC
  Administered 2014-12-26: 40 mg via INTRAVENOUS
  Filled 2014-12-26: qty 4

## 2014-12-26 MED ORDER — FERROUS SULFATE 325 (65 FE) MG PO TABS
325.0000 mg | ORAL_TABLET | Freq: Every day | ORAL | Status: DC
  Administered 2014-12-27: 325 mg via ORAL
  Filled 2014-12-26 (×2): qty 1

## 2014-12-26 MED ORDER — GUAIFENESIN-DM 100-10 MG/5ML PO SYRP: 5.0000 mL | ORAL_SOLUTION | ORAL | Status: DC | PRN

## 2014-12-26 MED ORDER — IOHEXOL 350 MG/ML SOLN
100.0000 mL | Freq: Once | INTRAVENOUS | Status: AC | PRN
  Administered 2014-12-26: 100 mL via INTRAVENOUS

## 2014-12-26 MED ORDER — CARBAMIDE PEROXIDE 6.5 % OT SOLN
5.0000 [drp] | Freq: Two times a day (BID) | OTIC | Status: DC
  Administered 2014-12-26 – 2014-12-27 (×2): 5 [drp] via OTIC
  Filled 2014-12-26: qty 15

## 2014-12-26 MED ORDER — PREDNISONE 20 MG PO TABS
40.0000 mg | ORAL_TABLET | Freq: Every day | ORAL | Status: DC
  Administered 2014-12-26: 40 mg via ORAL
  Filled 2014-12-26 (×2): qty 2

## 2014-12-26 MED ORDER — ONDANSETRON HCL 4 MG/2ML IJ SOLN: 4.0000 mg | Freq: Four times a day (QID) | INTRAMUSCULAR | Status: DC | PRN

## 2014-12-26 MED ORDER — CARVEDILOL 12.5 MG PO TABS
12.5000 mg | ORAL_TABLET | Freq: Two times a day (BID) | ORAL | Status: DC
  Administered 2014-12-26 – 2014-12-27 (×2): 12.5 mg via ORAL
  Filled 2014-12-26 (×4): qty 1

## 2014-12-26 MED ORDER — FUROSEMIDE 10 MG/ML IJ SOLN
40.0000 mg | Freq: Two times a day (BID) | INTRAMUSCULAR | Status: DC
Start: 2014-12-26 — End: 2014-12-27
  Administered 2014-12-27 (×2): 40 mg via INTRAVENOUS
  Filled 2014-12-26 (×3): qty 4

## 2014-12-26 MED ORDER — ATORVASTATIN CALCIUM 40 MG PO TABS
40.0000 mg | ORAL_TABLET | Freq: Every day | ORAL | Status: DC
  Administered 2014-12-26: 40 mg via ORAL
  Filled 2014-12-26 (×2): qty 1

## 2014-12-26 MED ORDER — AZITHROMYCIN 500 MG PO TABS
500.0000 mg | ORAL_TABLET | ORAL | Status: DC
  Administered 2014-12-27: 500 mg via ORAL
  Filled 2014-12-26: qty 1

## 2014-12-26 MED ORDER — CEFTRIAXONE SODIUM IN DEXTROSE 20 MG/ML IV SOLN
1.0000 g | INTRAVENOUS | Status: DC
  Administered 2014-12-27: 1 g via INTRAVENOUS
  Filled 2014-12-26: qty 50

## 2014-12-26 MED ORDER — AZITHROMYCIN 500 MG IV SOLR
500.0000 mg | INTRAVENOUS | Status: DC
  Administered 2014-12-26: 500 mg via INTRAVENOUS
  Filled 2014-12-26: qty 500

## 2014-12-26 MED ORDER — TIOTROPIUM BROMIDE MONOHYDRATE 18 MCG IN CAPS
18.0000 ug | ORAL_CAPSULE | Freq: Every day | RESPIRATORY_TRACT | Status: DC
  Administered 2014-12-26: 18 ug via RESPIRATORY_TRACT
  Filled 2014-12-26: qty 5

## 2014-12-26 MED ORDER — ONDANSETRON HCL 4 MG PO TABS: 4.0000 mg | ORAL_TABLET | Freq: Four times a day (QID) | ORAL | Status: DC | PRN

## 2014-12-26 MED ORDER — ALBUTEROL SULFATE (2.5 MG/3ML) 0.083% IN NEBU: 2.5000 mg | INHALATION_SOLUTION | Freq: Every day | RESPIRATORY_TRACT | Status: DC | PRN

## 2014-12-26 MED ORDER — SODIUM CHLORIDE 0.9 % IJ SOLN
3.0000 mL | Freq: Two times a day (BID) | INTRAMUSCULAR | Status: DC
  Administered 2014-12-27: 3 mL via INTRAVENOUS

## 2014-12-26 MED ORDER — INSULIN GLARGINE 100 UNIT/ML ~~LOC~~ SOLN
30.0000 [IU] | Freq: Every day | SUBCUTANEOUS | Status: DC
  Administered 2014-12-26: 30 [IU] via SUBCUTANEOUS
  Filled 2014-12-26 (×2): qty 0.3

## 2014-12-26 MED ORDER — FUROSEMIDE 10 MG/ML IJ SOLN: 40.0000 mg | Freq: Two times a day (BID) | INTRAMUSCULAR | Status: DC

## 2014-12-26 MED ORDER — GABAPENTIN 300 MG PO CAPS
300.0000 mg | ORAL_CAPSULE | Freq: Every day | ORAL | Status: DC
  Administered 2014-12-26: 300 mg via ORAL
  Filled 2014-12-26 (×2): qty 1

## 2014-12-26 MED ORDER — DEXTROSE 5 % IV SOLN
1.0000 g | INTRAVENOUS | Status: DC
  Administered 2014-12-26: 1 g via INTRAVENOUS
  Filled 2014-12-26: qty 10

## 2014-12-26 NOTE — ED Notes (Signed)
RN attempted to call report x2.

## 2014-12-26 NOTE — ED Notes (Signed)
Vital signs stable.\[id number]\

## 2014-12-26 NOTE — ED Provider Notes (Signed)
CSN: [id number]     Arrival date & time 12/26/14  1016 History   First MD Initiated Contact with Patient 12/26/14 1052     Chief Complaint  Patient presents with  . Shortness of Breath     (Consider location/radiation/quality/duration/timing/severity/associated sxs/prior Treatment) HPI Comments: Patient presents to the ER for evaluation of shortness of breath. Patient reports that he has been short of breath for the last 3 days. He has not had any chest pain associated with the symptoms. He reports decreased exercise tolerance and easy fatigability, however. He has had a cough, productive of yellow sputum. He has not had a fever.  Patient is a 81 y.o. male presenting with shortness of breath.  Shortness of Breath Associated symptoms: cough     Past Medical History  Diagnosis Date  . Diabetes mellitus   . Hypertension   . CHF (congestive heart failure)   . Cancer    Past Surgical History  Procedure Laterality Date  . Lung surgery    . Back surgery     No family history on file. History  Substance Use Topics  . Smoking status: Former Research scientist (life sciences)  . Smokeless tobacco: Not on file  . Alcohol Use: No    Review of Systems  Respiratory: Positive for cough and shortness of breath.   All other systems reviewed and are negative.     Allergies  Review of patient's allergies indicates no known allergies.  Home Medications   Prior to Admission medications   Not on File   BP 121/76 mmHg  Pulse 80  Temp(Src) 97.5 F (36.4 C) (Oral)  Resp 22  SpO2 93% Physical Exam  Constitutional: He is oriented to person, place, and time. He appears well-developed and well-nourished. No distress.  HENT:  Head: Normocephalic and atraumatic.  Right Ear: Hearing normal.  Left Ear: Hearing normal.  Nose: Nose normal.  Mouth/Throat: Oropharynx is clear and moist and mucous membranes are normal.  Eyes: Conjunctivae and EOM are normal. Pupils are equal, round, and reactive to light.  Neck:  Normal range of motion. Neck supple.  Cardiovascular: Regular rhythm, S1 normal and S2 normal.  Exam reveals no gallop and no friction rub.   No murmur heard. Pulmonary/Chest: Effort normal and breath sounds normal. No respiratory distress. He exhibits no tenderness.  Abdominal: Soft. Normal appearance and bowel sounds are normal. There is no hepatosplenomegaly. There is no tenderness. There is no rebound, no guarding, no tenderness at McBurney's point and negative Murphy's sign. No hernia.  Musculoskeletal: Normal range of motion. He exhibits edema (trace).  Neurological: He is alert and oriented to person, place, and time. He has normal strength. No cranial nerve deficit or sensory deficit. Coordination normal. GCS eye subscore is 4. GCS verbal subscore is 5. GCS motor subscore is 6.  Skin: Skin is warm, dry and intact. No rash noted. No cyanosis.  Psychiatric: He has a normal mood and affect. His speech is normal and behavior is normal. Thought content normal.  Nursing note and vitals reviewed.   ED Course  Procedures (including critical care time) Labs Review Labs Reviewed  CBC - Abnormal; Notable for the following:    Hemoglobin 12.8 (*)    All other components within normal limits  BASIC METABOLIC PANEL  BRAIN NATRIURETIC PEPTIDE  I-STAT TROPOININ, ED    Imaging Review No results found.   EKG Interpretation   Date/Time:  Friday December 26 2014 10:26:59 EST Ventricular Rate:  80 PR Interval:  206 QRS Duration:  132 QT Interval:  414 QTC Calculation: 477 R Axis:   50 Text Interpretation:  Normal sinus rhythm Non-specific intra-ventricular  conduction block Inferior infarct , age undetermined T wave abnormality,  consider lateral ischemia Abnormal ECG No change compared to April 2012  Confirmed by WARD,  DO, KRISTEN [phone]) on 12/26/2014 10:31:36 AM      MDM   Final diagnoses:  None   CHF  Community-acquired pneumonia  Presents to the ER for evaluation of shortness  of breath over the last 3 days. He has had a cough that is productive of yellow sputum. He is not expressing any chest pain. Patient reports a history of congestive heart failure, has not had any significant increase in his swelling. Patient does have a previous history of lung cancer status post resection, radiation therapy. X-ray showed opacities in the area of the surgical site, could not rule out recurrence of tumor. CT angiography performed. No evidence of PE. Patient does have significant opacity and air bronchograms in the area of his past surgery. This is concerning for pneumonia, although radiation changes cannot be ruled out.  Patient was not hypoxic at arrival to the ER, but he did desat with exertion. Sats are in the 80% range with ambulation in the hallway and he did become short of breath. Patient will therefore require hospitalization for treatment of community acquired pneumonia, diuresis.  Patient does not have a local PCP, primary care and cardiologist is at the [place].  Orpah Greek, MD 12/26/14 [phone]

## 2014-12-26 NOTE — H&P (Signed)
[facility] History and Physical  John Gilbert JJO:[id number] DOB: 1934-09-03 DOA: 12/26/2014  Referring physician: Dr Betsey Holiday  PCP: follows at [facility]   Chief Complaint:  Cough with shortness of breath  X 3-4 days  HPI:  81 year old male with history of hypertension, CHF, lung cancer status post lobectomy, radiation and chemotherapy, diabetes mellitus on insulin who presented to ED with shortness of breath with cough the past 3-4 days. Patient reports dyspnea on minimal exertion which has been progressive. He also reports cough with some mucus phlegm. Denies any fevers or chills. Denies any sick contacts or recent travel. She is a poor historian and is unable to provide much history. Denies any fevers or chills. Patient denies headache, dizziness,, nausea , vomiting, chest pain, palpitations, abdominal pain, bowel or urinary symptoms. Denies change in weight or appetite. At baseline he reports being fairly ambulatory.  In the ED patient's vitals were stable. He was satting in mid 90s on room air. Blood work done was unremarkable except for BNP close to 3000.  Chest x-ray was done in the ED which showed surgical changes with extensive radiation changes in the left hemithorax. This was followed by CT angiogram of the chest which was negative for PE but showed dense consolidation throughout the upper portion of the left lung likely representing pneumonia versus postradiation changes. Also showed filling defect in the left mainstem bronchus likely of a mucous plug. Showed moderate-sized right pleural effusion and moderate-sized pericardial effusion. He also had severe three-vessel coronary atherosclerosis. Given IV Rocephin and azithromycin and hospitalists admission requested to telemetry.  Review of Systems:  Constitutional: Denies fever, chills, diaphoresis, appetite change and fatigue.  HEENT: Denies visual or hearing symptoms, congestion, sore throat, difficulty swallowing  or neck pain Respiratory: SOB, DOE, cough, denies chest tightness,  and wheezing.   Cardiovascular: Denies chest pain, palpitations ,  leg swelling+.  Gastrointestinal: Denies nausea, vomiting, abdominal pain, diarrhea, constipation, blood in stool and abdominal distention.  Genitourinary: Denies dysuria,  hematuria, flank pain and difficulty urinating.  Endocrine: Denies: hot or cold intolerance, polyuria, polydipsia. Musculoskeletal: Denies myalgias, back pain, joint pain Skin: Denies , rash and wound.  Neurological: Denies dizziness, , syncope, weakness, light-headedness, numbness and headaches.  Hematological: Denies adenopathy.  Psychiatric/Behavioral: Denies infusion  Past Medical History  Diagnosis Date  . Diabetes mellitus   . Hypertension   . CHF (congestive heart failure)   . Cancer    Past Surgical History  Procedure Laterality Date  . Lung surgery    . Back surgery     Social History:  reports that he has quit smoking. He does not have any smokeless tobacco history on file. He reports that he does not drink alcohol. His drug history is not on file.  Allergies  Allergen Reactions  . Lisinopril Swelling    Tongue swelled (2 months)  . Morphine And Related     Messes with his mind/head    No family history on file.  Prior to Admission medications   Medication Sig Start Date End Date Taking? Authorizing Provider  albuterol (PROVENTIL) (2.5 MG/3ML) 0.083% nebulizer solution Take 2.5 mg by nebulization daily as needed for wheezing or shortness of breath.   Yes Historical Provider, MD  atorvastatin (LIPITOR) 80 MG tablet Take 40 mg by mouth at bedtime.   Yes Historical Provider, MD  carbamide peroxide (DEBROX) 6.5 % otic solution Place 5-10 drops into both ears 2 (two) times daily.   Yes Historical Provider, MD  carvedilol (COREG)  25 MG tablet Take 12.5 mg by mouth 2 (two) times daily with a meal.   Yes Historical Provider, MD  ferrous sulfate 325 (65 FE) MG tablet Take  325 mg by mouth daily with breakfast.   Yes Historical Provider, MD  furosemide (LASIX) 40 MG tablet Take 40 mg by mouth 2 (two) times daily.   Yes Historical Provider, MD  gabapentin (NEURONTIN) 300 MG capsule Take 300 mg by mouth at bedtime.   Yes Historical Provider, MD  insulin aspart (NOVOLOG) 100 UNIT/ML injection Inject 10 Units into the skin 3 (three) times daily before meals.   Yes Historical Provider, MD  insulin glargine (LANTUS) 100 UNIT/ML injection Inject 30 Units into the skin at bedtime.   Yes Historical Provider, MD  omeprazole (PRILOSEC) 20 MG capsule Take 20 mg by mouth daily.   Yes Historical Provider, MD  tiotropium (SPIRIVA) 18 MCG inhalation capsule Place 18 mcg into inhaler and inhale daily.   Yes Historical Provider, MD     Physical Exam:  Filed Vitals:   12/26/14 1400 12/26/14 1511 12/26/14 1630 12/26/14 1700  BP: 129/93 119/60    Pulse: 73 76 74 73  Temp:      TempSrc:      Resp: 15 12 21 16   SpO2: 98% 96% 98% 94%    Constitutional: Vital signs reviewed.  Early male in no acute distress HEENT: no pallor, no icterus, moist oral mucosa, no cervical lymphadenopathy Cardiovascular: RRR, S1 normal, S2 normal, no MRG Chest: Fine bibasilar crackles, no rhonchi or wheeze Gastrointestinal: Soft. Non-tender, non-distended, bowel sounds are normal, musculoskeletal: Warm, 1+ pitting edema bilaterally  Neurological: Alert and oriented  Labs on Admission:  Basic Metabolic Panel:  Recent Labs Lab 12/26/14 1045  NA 134*  K 4.4  CL 100  CO2 31  GLUCOSE 120*  BUN 19  CREATININE 1.17  CALCIUM 9.7   Liver Function Tests: No results for input(s): AST, ALT, ALKPHOS, BILITOT, PROT, ALBUMIN in the last 168 hours. No results for input(s): LIPASE, AMYLASE in the last 168 hours. No results for input(s): AMMONIA in the last 168 hours. CBC:  Recent Labs Lab 12/26/14 1045  WBC 6.8  HGB 12.8*  HCT 39.5  MCV 91.4  PLT 164   Cardiac Enzymes: No results for  input(s): CKTOTAL, CKMB, CKMBINDEX, TROPONINI in the last 168 hours. BNP: Invalid input(s): POCBNP CBG: No results for input(s): GLUCAP in the last 168 hours.  Radiological Exams on Admission: Dg Chest 2 View  12/26/2014   CLINICAL DATA:  Shortness of breath for 3 days. History of lung cancer.  EXAM: CHEST  2 VIEW  COMPARISON:  Chest x-ray 03/22/2011  FINDINGS: A permanent right-sided pacemaker is noted with a single right ventricular wire. The cardiac silhouette, mediastinal and hilar contours are grossly stable. There are stable surgical changes involving the left hemi thorax along with near complete opacification of left upper hemi thorax which is new. This could be radiation change but could not exclude recurrent tumor. There is significant traction and elevation of the left hemidiaphragm which is most likely radiation related. The right lung demonstrates stable pulmonary nodules. No pleural effusion. The bony thorax is grossly intact. Stable rib resections from a prior thoracotomy.  IMPRESSION: Surgical changes and probable extensive radiation changes involving the left hemithorax. Any more recent prior chest x-rays would be helpful for comparison as I could not exclude recurrent tumor in the left upper lung.  Stable right-sided pulmonary nodules.   Electronically Signed  By: Kalman Jewels M.D.   On: 12/26/2014 12:13   Ct Angio Chest Pe W/cm &/or Wo Cm  12/26/2014   CLINICAL DATA:  Chronic shortness of breath which has progressively worsened over the past 3-4 days, now with productive cough.  EXAM: CT ANGIOGRAPHY CHEST WITH CONTRAST  TECHNIQUE: Multidetector CT imaging of the chest was performed using the standard protocol during bolus administration of intravenous contrast. Multiplanar CT image reconstructions and MIPs were obtained to evaluate the vascular anatomy.  CONTRAST:  140mL OMNIPAQUE IOHEXOL 350 MG/ML IV.  COMPARISON:  CTA chest 03/27/2011, 03/22/2011.  FINDINGS: Contrast opacification  of the pulmonary arteries is good in the right lung but only fair in the left lung. Respiratory motion blurred many images.  No filling defects within either main pulmonary artery or their visualized branches in either lung. Heart markedly enlarged with left ventricular enlargement, right ventricular hypertrophy, and right atrial enlargement. Small to moderate-sized pericardial effusion in the superior recess. Reflux of contrast from the right atrium into the IVC and the central hepatic veins. Severe 3 vessel coronary atherosclerosis. Moderate aortic valvular calcification. Pacemaker lead tip at the RV apex. Severe atherosclerosis involving the thoracic and upper abdominal aorta. Penetrating ulcer involving the distal aortic arch measuring approximately 1.7 cm at its base with a depth of approximately 1.4 mm, not significantly changed from the prior examinations.  Prior left upper lobectomy. Interval development of consolidation throughout the upper portion of the remaining left lung. Marked bronchial wall thickening involving left lower lobe segmental bronchi, associated with patchy airspace opacities at the left lung base. Moderately large right pleural effusion and associated mild passive atelectasis in the right lower lobe. Approximate 6 mm nodule in the inferior right upper lobe, minimally increased in size since 2012. Stable pleuroparenchymal scarring with associated calcification and bronchiectasis in the right apex. Calcified scar in the left apex, also stable. Emphysematous changes in the right lung apex. Filling defect in the left mainstem bronchus. Central airways otherwise patent. Moderate to marked bronchial wall thickening centrally in the right lung.  Enlarged subcarinal lymph nodes measuring approximately 1.7 x 4.0 cm, unchanged. No new or enlarging lymphadenopathy. Collateral vessels in the anterior mediastinum, unchanged. Stable asymmetric enlargement of the right lobe of the thyroid gland without  significant nodularity.  Low-attenuation left adrenal mass measuring approximately 2.8 x 1.4 cm, unchanged. Remaining visualized upper abdomen unremarkable. Bone window images demonstrate minimal-to-mild mid and lower thoracic spondylosis.  Review of the MIP images confirms the above findings.  IMPRESSION: 1. No evidence of pulmonary embolism. 2. Stable penetrating ulcer arising from the distal aortic arch dating back to 2012. 3. Prior left upper lobectomy. Dense consolidation throughout the upper portion of the remaining left lung with associated air bronchograms and patchy opacity in the base of the remaining left lung likely represents pneumonia and/or post radiation changes. However, follow-up CT in 1-2 months after treatment is recommended to exclude underlying recurrent tumor. 4. Filling defect in the left mainstem bronchus likely a mucous plug. 5. Moderate size right pleural effusion. 6. Cardiomegaly with left ventricular enlargement, right ventricular hypertrophy, left atrial enlargement. Severe 3 vessel coronary atherosclerosis. Moderate-sized pericardial effusion. 7. Stable subcarinal mediastinal lymphadenopathy dating back to 2012, therefore likely reactive. 8. Stable left adrenal mass dating back 2012, therefore benign adenoma.   Electronically Signed   By: Evangeline Dakin M.D.   On: 12/26/2014 15:08    EKG: Sinus rhythm at 80, T-wave inversion in lateral leads, no old EKG to compare.  Assessment/Plan  principle problem  Community acquired pneumonia Admit to telemetry. Have a component of radiation pneumonitis. continue IV rocephin and azithromycin. Will add oral prednisone 40 mg daily. Follow blood cx. Sputum cx, urine for strep and legionella.  02 via [employer] prn. Continue home inhalers. Add when necessary nebs -Recommend follow-up chest CT in 6 weeks to ensure resolution.   Active Problems:  Congestive heart failure Patient has bibasilar crackles with bilateral leg edema and elevated  BNP. No baseline level of CHF none. Will place on IV Lasix 40 mg twice daily. Monitor strict I/O and daily weight. Continue Coreg. Check 2D echo  Diabetes mellitus  Check A1C, monitor fsg. Resume home dose Lantus. Monitor on SSI  COPD Quit smoking a few years back. Continue albuterol and Atrovent inhaler. Continue when necessary albuterol nebulizer. Continue O2 via nasal cannula when necessary  History of lung cancer Reports undergoing radiation chemotherapy until 15 months back. Unable to provide further detail.  Moderate pericardial effusion  as seen on CT chest. Follow with 2D echo  CAD Extensive atherosclerosis seen on CT chest. Continue Coreg and statin  Diet:cardiac/diabetic  DVT prophylaxis: sq lovenox   Code Status: full code Family Communication: None at bedside Disposition Plan: Admit to telemetry  [place], [place] [facility] Pager [phone]  Total time spent on admission :60 minutes  If 7PM-7AM, please contact night-coverage [email or web address] Password [email or web address] 12/26/2014, 5:29 PM

## 2014-12-26 NOTE — ED Notes (Signed)
Pt given graham crackers and peanut butter. Diet Coke.

## 2014-12-26 NOTE — ED Notes (Signed)
Pt reports feeling sob for 3 days. Denies cp. Has productive cough, yellow sputum. Pt is a x 4. Has hx of CHF.

## 2014-12-27 ENCOUNTER — Encounter (HOSPITAL_COMMUNITY): Payer: Self-pay | Admitting: Surgery

## 2014-12-27 DIAGNOSIS — J189 Pneumonia, unspecified organism: Principal | ICD-10-CM

## 2014-12-27 DIAGNOSIS — I5023 Acute on chronic systolic (congestive) heart failure: Secondary | ICD-10-CM | POA: Diagnosis present

## 2014-12-27 DIAGNOSIS — J439 Emphysema, unspecified: Secondary | ICD-10-CM

## 2014-12-27 DIAGNOSIS — I509 Heart failure, unspecified: Secondary | ICD-10-CM

## 2014-12-27 DIAGNOSIS — E1165 Type 2 diabetes mellitus with hyperglycemia: Secondary | ICD-10-CM

## 2014-12-27 DIAGNOSIS — I255 Ischemic cardiomyopathy: Secondary | ICD-10-CM

## 2014-12-27 DIAGNOSIS — I213 ST elevation (STEMI) myocardial infarction of unspecified site: Secondary | ICD-10-CM

## 2014-12-27 DIAGNOSIS — I5043 Acute on chronic combined systolic (congestive) and diastolic (congestive) heart failure: Secondary | ICD-10-CM

## 2014-12-27 LAB — GLUCOSE, CAPILLARY
Glucose-Capillary: 246 mg/dL — ABNORMAL HIGH (ref 70–99)
Glucose-Capillary: 258 mg/dL — ABNORMAL HIGH (ref 70–99)
Glucose-Capillary: 396 mg/dL — ABNORMAL HIGH (ref 70–99)

## 2014-12-27 LAB — TROPONIN I: Troponin I: 0.07 ng/mL — ABNORMAL HIGH (ref <0.031)

## 2014-12-27 LAB — STREP PNEUMONIAE URINARY ANTIGEN: Strep Pneumo Urinary Antigen: NEGATIVE

## 2014-12-27 MED ORDER — FUROSEMIDE 20 MG PO TABS: 60.0000 mg | ORAL_TABLET | Freq: Two times a day (BID) | ORAL | Status: AC

## 2014-12-27 MED ORDER — GUAIFENESIN-DM 100-10 MG/5ML PO SYRP: 5.0000 mL | ORAL_SOLUTION | ORAL | Status: AC | PRN

## 2014-12-27 MED ORDER — INSULIN ASPART 100 UNIT/ML ~~LOC~~ SOLN
11.0000 [IU] | Freq: Once | SUBCUTANEOUS | Status: AC
  Administered 2014-12-27: 11 [IU] via SUBCUTANEOUS

## 2014-12-27 MED ORDER — ASPIRIN EC 81 MG PO TBEC: 81.0000 mg | DELAYED_RELEASE_TABLET | Freq: Every day | ORAL | Status: AC

## 2014-12-27 MED ORDER — INSULIN ASPART 100 UNIT/ML ~~LOC~~ SOLN: 0.0000 [IU] | Freq: Every day | SUBCUTANEOUS | Status: DC

## 2014-12-27 MED ORDER — FUROSEMIDE 20 MG PO TABS: 60.0000 mg | ORAL_TABLET | Freq: Two times a day (BID) | ORAL | Status: DC

## 2014-12-27 MED ORDER — ASPIRIN EC 325 MG PO TBEC
325.0000 mg | DELAYED_RELEASE_TABLET | Freq: Every day | ORAL | Status: DC
  Administered 2014-12-27: 325 mg via ORAL
  Filled 2014-12-27: qty 1

## 2014-12-27 MED ORDER — AZITHROMYCIN 500 MG PO TABS: 500.0000 mg | ORAL_TABLET | Freq: Every day | ORAL | Status: AC

## 2014-12-27 MED ORDER — INSULIN GLARGINE 100 UNIT/ML ~~LOC~~ SOLN: 33.0000 [IU] | Freq: Every day | SUBCUTANEOUS | Status: DC

## 2014-12-27 MED ORDER — FUROSEMIDE 40 MG PO TABS
60.0000 mg | ORAL_TABLET | Freq: Every day | ORAL | Status: DC
  Filled 2014-12-27: qty 1

## 2014-12-27 MED ORDER — INSULIN ASPART 100 UNIT/ML ~~LOC~~ SOLN: 0.0000 [IU] | Freq: Three times a day (TID) | SUBCUTANEOUS | Status: DC

## 2014-12-27 MED ORDER — INSULIN GLARGINE 100 UNIT/ML ~~LOC~~ SOLN
35.0000 [IU] | Freq: Every day | SUBCUTANEOUS | Status: DC
  Filled 2014-12-27: qty 0.35

## 2014-12-27 MED ORDER — PREDNISONE 10 MG PO TABS: ORAL_TABLET | ORAL | Status: AC

## 2014-12-27 MED ORDER — CEFUROXIME AXETIL 500 MG PO TABS: 500.0000 mg | ORAL_TABLET | Freq: Two times a day (BID) | ORAL | Status: AC

## 2014-12-27 NOTE — Progress Notes (Signed)
Patient is being discharged to home with wife. DC IV, DC tele. All discharge instructions explained, patient verbalized understanding.

## 2014-12-27 NOTE — Discharge Summary (Signed)
Physician Discharge Summary  Patient ID: John Gilbert MRN: [id number] DOB/AGE: 12/04/1933 81 y.o.  Admit date: 12/26/2014 Discharge date: 12/27/2014  Primary Care Physician:  Pcp Not In System  Discharge Diagnoses:    . Community acquired pneumonia . Lung cancer . COPD (chronic obstructive pulmonary disease) . Acute on chronic systolic CHF (congestive heart failure)  Consults:  Cardiology   Recommendations for Outpatient Follow-up:  Lasix was increased to 60mg  BID   TESTS THAT NEED FOLLOW-UP Renal function   CT chest showed  Dense consolidation throughout the upper portion of the remaining left lung with associated air bronchograms and patchy opacity in the base of the remaining left lung likely represents pneumonia and/or post radiation changes. However, follow-up CT in 1-2 months after treatment is recommendedto exclude underlying recurrent tumor.   DIET: carb modified diet, fluid restriction 2000cc/24hrs     Allergies:   Allergies  Allergen Reactions  . Lisinopril Swelling    Tongue swelled (2 months)  . Morphine And Related     Messes with his mind/head     Discharge Medications:   Medication List    TAKE these medications        albuterol (2.5 MG/3ML) 0.083% nebulizer solution  Commonly known as:  PROVENTIL  Take 2.5 mg by nebulization daily as needed for wheezing or shortness of breath.     aspirin EC 81 MG tablet  Take 1 tablet (81 mg total) by mouth daily.     atorvastatin 80 MG tablet  Commonly known as:  LIPITOR  Take 40 mg by mouth at bedtime.     azithromycin 500 MG tablet  Commonly known as:  ZITHROMAX  Take 1 tablet (500 mg total) by mouth daily. X 1 week     carbamide peroxide 6.5 % otic solution  Commonly known as:  DEBROX  Place 5-10 drops into both ears 2 (two) times daily.     carvedilol 25 MG tablet  Commonly known as:  COREG  Take 12.5 mg by mouth 2 (two) times daily with a meal.     cefUROXime 500 MG tablet  Commonly  known as:  CEFTIN  Take 1 tablet (500 mg total) by mouth 2 (two) times daily with a meal. X 1 week     ferrous sulfate 325 (65 FE) MG tablet  Take 325 mg by mouth daily with breakfast.     furosemide 20 MG tablet  Commonly known as:  LASIX  Take 3 tablets (60 mg total) by mouth 2 (two) times daily.     gabapentin 300 MG capsule  Commonly known as:  NEURONTIN  Take 300 mg by mouth at bedtime.     guaiFENesin-dextromethorphan 100-10 MG/5ML syrup  Commonly known as:  ROBITUSSIN DM  Take 5 mLs by mouth every 4 (four) hours as needed for cough.     insulin aspart 100 UNIT/ML injection  Commonly known as:  novoLOG  Inject 10 Units into the skin 3 (three) times daily before meals.     insulin glargine 100 UNIT/ML injection  Commonly known as:  LANTUS  Inject 30 Units into the skin at bedtime.     omeprazole 20 MG capsule  Commonly known as:  PRILOSEC  Take 20 mg by mouth daily.     predniSONE 10 MG tablet  Commonly known as:  DELTASONE  Prednisone dosing: Take  Prednisone 40mg  (4 tabs) x 1days, then taper to 30mg  (3 tabs) x 3 days, then 20mg  (2 tabs) x 3days, then 10mg  (1  tab) x 3days, then OFF.  Start taking on:  12/28/2014     tiotropium 18 MCG inhalation capsule  Commonly known as:  SPIRIVA  Place 18 mcg into inhaler and inhale daily.         Brief H and P: For complete details please refer to admission H and P, but in brief *81 year old male with history of hypertension, CHF, lung cancer status post lobectomy, radiation and chemotherapy, diabetes mellitus on insulin who presented to ED with shortness of breath with cough the past 3-4 days. Patient reported dyspnea on minimal exertion which has been progressive. He also reported cough with some mucus phlegm. Denied any fevers or chills. Denied any sick contacts or recent travel. At baseline he reports being fairly ambulatory. In the ED patient's vitals were stable, O2 sats in mid 90s on room air, BNP 2995.  Chest x-ray was  done in the ED which showed surgical changes with extensive radiation changes in the left hemithorax. This was followed by CT angiogram of the chest which was negative for PE but showed dense consolidation throughout the upper portion of the left lung likely representing pneumonia versus postradiation changes. Also showed filling defect in the left mainstem bronchus likely of a mucous plug. Showed moderate-sized right pleural effusion and moderate-sized pericardial effusion. He also had severe three-vessel coronary atherosclerosis.   Hospital Course:  Community acquired pneumonia with moderate-sized right pleural effusion Patient was placed on IV Zithromax, Rocephin and IV lasix which significantly improved his symptoms.  He was placed on albuterol nebs as needed, urine strep antigen negative, blood cultures negative so far. Patient was transitioned to oral antibiotics and lasix.  Patient will need repeat CT chest 1-2 months to ensure complete resolution of the pneumonia.     Acute on chronic combined systolic and diastolic CHF (congestive heart failure) 2-D echo showed EF of 20-25%, possible small flat mural, apical inferior thrombus associated with akinetic segment, severe hypokinesis, grade 2 diastolic dysfunction. Patient was placed on  IV Lasix, strict I's and O's and daily weights and cardiology consult was obtained.  Continue statin, Coreg, and aspirin Per Dr Sallyanne Kuster, who was able to review prior records from [facility] and prior cardic work up stated that the EF has remained stable and the mural thrombus is chronic and old and does not warrant anticoagulation. Patient will need to follow at [facility] in 2 weeks and if not able to    Diabetes mellitus, type 2: Uncontrolled due to steroids - Continue Lantus and meal coverage insulin    Lung cancer: Status post lobectomy, radiation and chemotherapy - Follow oncology outpatient   COPD (chronic obstructive pulmonary disease)With exacerbation -  Continue duo nebs, prednisone with taper and antibiotics    Day of Discharge BP 123/78 mmHg  Pulse 76  Temp(Src) 98.3 F (36.8 C) (Oral)  Resp 18  Ht 6' 3.25" (1.911 m)  Wt 90.175 kg (198 lb 12.8 oz)  BMI 24.69 kg/m2  SpO2 98%  Physical Exam: General: Alert and awake oriented x3 not in any acute distress. HEENT: anicteric sclera, pupils reactive to light and accommodation CVS: S1-S2 clear, 1/6 holosytolic murmur  Chest: clear to auscultation bilaterally, no wheezing rales or rhonchi Abdomen: soft nontender, nondistended, normal bowel sounds Extremities: no cyanosis, clubbing or edema noted bilaterally Neuro: Cranial nerves II-XII intact, no focal neurological deficits   The results of significant diagnostics from this hospitalization (including imaging, microbiology, ancillary and laboratory) are listed below for reference.    LAB RESULTS: Basic  Metabolic Panel:  Recent Labs Lab 12/26/14 1045  NA 134*  K 4.4  CL 100  CO2 31  GLUCOSE 120*  BUN 19  CREATININE 1.17  CALCIUM 9.7   Liver Function Tests: No results for input(s): AST, ALT, ALKPHOS, BILITOT, PROT, ALBUMIN in the last 168 hours. No results for input(s): LIPASE, AMYLASE in the last 168 hours. No results for input(s): AMMONIA in the last 168 hours. CBC:  Recent Labs Lab 12/26/14 1045  WBC 6.8  HGB 12.8*  HCT 39.5  MCV 91.4  PLT 164   Cardiac Enzymes: No results for input(s): CKTOTAL, CKMB, CKMBINDEX, TROPONINI in the last 168 hours. BNP: Invalid input(s): POCBNP CBG:  Recent Labs Lab 12/27/14 0549 12/27/14 1154  GLUCAP 258* 396*    Significant Diagnostic Studies:  Dg Chest 2 View  12/26/2014   CLINICAL DATA:  Shortness of breath for 3 days. History of lung cancer.  EXAM: CHEST  2 VIEW  COMPARISON:  Chest x-ray 03/22/2011  FINDINGS: A permanent right-sided pacemaker is noted with a single right ventricular wire. The cardiac silhouette, mediastinal and hilar contours are grossly stable.  There are stable surgical changes involving the left hemi thorax along with near complete opacification of left upper hemi thorax which is new. This could be radiation change but could not exclude recurrent tumor. There is significant traction and elevation of the left hemidiaphragm which is most likely radiation related. The right lung demonstrates stable pulmonary nodules. No pleural effusion. The bony thorax is grossly intact. Stable rib resections from a prior thoracotomy.  IMPRESSION: Surgical changes and probable extensive radiation changes involving the left hemithorax. Any more recent prior chest x-rays would be helpful for comparison as I could not exclude recurrent tumor in the left upper lung.  Stable right-sided pulmonary nodules.   Electronically Signed   By: Kalman Jewels M.D.   On: 12/26/2014 12:13   Ct Angio Chest Pe W/cm &/or Wo Cm  12/26/2014   CLINICAL DATA:  Chronic shortness of breath which has progressively worsened over the past 3-4 days, now with productive cough.  EXAM: CT ANGIOGRAPHY CHEST WITH CONTRAST  TECHNIQUE: Multidetector CT imaging of the chest was performed using the standard protocol during bolus administration of intravenous contrast. Multiplanar CT image reconstructions and MIPs were obtained to evaluate the vascular anatomy.  CONTRAST:  148mL OMNIPAQUE IOHEXOL 350 MG/ML IV.  COMPARISON:  CTA chest 03/27/2011, 03/22/2011.  FINDINGS: Contrast opacification of the pulmonary arteries is good in the right lung but only fair in the left lung. Respiratory motion blurred many images.  No filling defects within either main pulmonary artery or their visualized branches in either lung. Heart markedly enlarged with left ventricular enlargement, right ventricular hypertrophy, and right atrial enlargement. Small to moderate-sized pericardial effusion in the superior recess. Reflux of contrast from the right atrium into the IVC and the central hepatic veins. Severe 3 vessel coronary  atherosclerosis. Moderate aortic valvular calcification. Pacemaker lead tip at the RV apex. Severe atherosclerosis involving the thoracic and upper abdominal aorta. Penetrating ulcer involving the distal aortic arch measuring approximately 1.7 cm at its base with a depth of approximately 1.4 mm, not significantly changed from the prior examinations.  Prior left upper lobectomy. Interval development of consolidation throughout the upper portion of the remaining left lung. Marked bronchial wall thickening involving left lower lobe segmental bronchi, associated with patchy airspace opacities at the left lung base. Moderately large right pleural effusion and associated mild passive atelectasis in the right lower  lobe. Approximate 6 mm nodule in the inferior right upper lobe, minimally increased in size since 2012. Stable pleuroparenchymal scarring with associated calcification and bronchiectasis in the right apex. Calcified scar in the left apex, also stable. Emphysematous changes in the right lung apex. Filling defect in the left mainstem bronchus. Central airways otherwise patent. Moderate to marked bronchial wall thickening centrally in the right lung.  Enlarged subcarinal lymph nodes measuring approximately 1.7 x 4.0 cm, unchanged. No new or enlarging lymphadenopathy. Collateral vessels in the anterior mediastinum, unchanged. Stable asymmetric enlargement of the right lobe of the thyroid gland without significant nodularity.  Low-attenuation left adrenal mass measuring approximately 2.8 x 1.4 cm, unchanged. Remaining visualized upper abdomen unremarkable. Bone window images demonstrate minimal-to-mild mid and lower thoracic spondylosis.  Review of the MIP images confirms the above findings.  IMPRESSION: 1. No evidence of pulmonary embolism. 2. Stable penetrating ulcer arising from the distal aortic arch dating back to 2012. 3. Prior left upper lobectomy. Dense consolidation throughout the upper portion of the  remaining left lung with associated air bronchograms and patchy opacity in the base of the remaining left lung likely represents pneumonia and/or post radiation changes. However, follow-up CT in 1-2 months after treatment is recommended to exclude underlying recurrent tumor. 4. Filling defect in the left mainstem bronchus likely a mucous plug. 5. Moderate size right pleural effusion. 6. Cardiomegaly with left ventricular enlargement, right ventricular hypertrophy, left atrial enlargement. Severe 3 vessel coronary atherosclerosis. Moderate-sized pericardial effusion. 7. Stable subcarinal mediastinal lymphadenopathy dating back to 2012, therefore likely reactive. 8. Stable left adrenal mass dating back 2012, therefore benign adenoma.   Electronically Signed   By: Evangeline Dakin M.D.   On: 12/26/2014 15:08    2D ECHO:  Study Conclusions  - Left ventricle: The cavity size was mildly dilated. Systolic function was severely reduced. The estimated ejection fraction was in the range of 20% to 25%. Akinesis and scarring of the inferolateral, inferior, and inferoseptal myocardium. Severe hypokinesis of the mid-apicalanteroseptal and anterior myocardium. Features are consistent with a pseudonormal left ventricular filling pattern, with concomitant abnormal relaxation and increased filling pressure (grade 2 diastolic dysfunction). There was a possible, small, flat (mural), apical inferiorthrombusassociated with an akinetic segment. - Aortic valve: There was trivial regurgitation. - Mitral valve: There was mild to moderate regurgitation directed eccentrically and anteriorly. - Left atrium: The atrium was mildly dilated. - Right ventricle: The cavity size was mildly dilated. Wall thickness was normal. Systolic function was moderately reduced. - Right atrium: The atrium was mildly dilated. - Pulmonary arteries: Systolic pressure was moderately increased. PA peak pressure: 52 mm Hg  (S). Disposition and Follow-up: Discharge Instructions    (HEART FAILURE PATIENTS) Call MD:  Anytime you have any of the following symptoms: 1) 3 pound weight gain in 24 hours or 5 pounds in 1 week 2) shortness of breath, with or without a dry hacking cough 3) swelling in the hands, feet or stomach 4) if you have to sleep on extra pillows at night in order to breathe.    Complete by:  As directed      Diet - low sodium heart healthy    Complete by:  As directed      Diet Carb Modified    Complete by:  As directed      Discharge instructions    Complete by:  As directed   It is VERY IMPORTANT that you follow up with a PCP on a regular basis.  Check your  blood glucoses before each meal and at bedtime and maintain a log of your readings.  Bring this log with you when you follow up with your PCP so that he or she can adjust your insulin at your follow up visit.     Increase activity slowly    Complete by:  As directed             DISPOSITION: home   DISCHARGE FOLLOW-UP Follow-up Information    Schedule an appointment as soon as possible for a visit in 10 days to follow up.   Why:  for hospital follow-up, YOU WILL NEED LABS FOR RENAL FUNCTION   Contact information:   [facility]      Follow up with CROITORU,MIHAI, MD. Schedule an appointment as soon as possible for a visit in 2 weeks.   Specialty:  Cardiology   Why:  for hospital follow-up if you are not able to follow-up at [facility] information:   [address] [place] [address] [phone]        Time spent on Discharge: 35 mins  Signed:   Ammi Hutt M.D. [facility] 12/27/2014, 3:41 PM Pager: [phone]

## 2014-12-27 NOTE — Progress Notes (Signed)
Patient ID: John Gilbert  male  ZOX:[id number]    DOB: 05-13-1934    DOA: 12/26/2014  PCP: Pcp Not In System  Brief history of present illness  81 year old male with history of hypertension, CHF, lung cancer status post lobectomy, radiation and chemotherapy, diabetes mellitus on insulin who presented to ED with shortness of breath with cough the past 3-4 days. Patient reported dyspnea on minimal exertion which has been progressive. He also reported cough with some mucus phlegm. Denied any fevers or chills. Denied any sick contacts or recent travel. At baseline he reports being fairly ambulatory. In the ED patient's vitals were stable, O2 sats in mid 90s on room air, BNP 2995.  Chest x-ray was done in the ED which showed surgical changes with extensive radiation changes in the left hemithorax. This was followed by CT angiogram of the chest which was negative for PE but showed dense consolidation throughout the upper portion of the left lung likely representing pneumonia versus postradiation changes. Also showed filling defect in the left mainstem bronchus likely of a mucous plug. Showed moderate-sized right pleural effusion and moderate-sized pericardial effusion. He also had severe three-vessel coronary atherosclerosis.  Assessment/Plan: Principal Problem:   Community acquired pneumonia with moderate-sized right pleural effusion - Continue IV Zithromax, Rocephin, on IV diuresis - Continue albuterol as needed, urine strep antigen negative, blood cultures pending  Active Problems:   Acute on chronic combined systolic and diastolic CHF (congestive heart failure) - 2-D echo showed EF of 20-25%, possible small flat mural, apical inferior thrombus associated with akinetic segment, severe hypokinesis, grade 2 diastolic dysfunction. - Continue IV Lasix, strict I's and O's and daily weights, called cardiology consult - Continue statin, Coreg, and aspirin    Diabetes mellitus, type 2: Uncontrolled  due to steroids - Continue Lantus, increase to 35 units, sliding scale insulin, placed on moderate scale    Lung cancer: Status post lobectomy, radiation and chemotherapy - Follow oncology outpatient     COPD (chronic obstructive pulmonary disease)With exacerbation - Continue duo nebs, prednisone, IV antibiotics   DVT Prophylaxis:Lovenox   Code Status:Full CODE STATUS   Family Communication:  Disposition:  Consultants:  Cardiology   Procedures:  CT scan chest,    2-D echo  Antibiotics:  IV Zithromax  IV Rocephin   Subjective: Patient seen and examined, states he feels a whole lot better wondering if he can go home, no chest pain   Objective: Weight change:   Intake/Output Summary (Last 24 hours) at 12/27/14 1154 Last data filed at 12/27/14 0908  Gross per 24 hour  Intake    820 ml  Output   1375 ml  Net   -555 ml   Blood pressure 123/78, pulse 76, temperature 98.3 F (36.8 C), temperature source Oral, resp. rate 18, height 6' 3.25" (1.911 m), weight 90.175 kg (198 lb 12.8 oz), SpO2 98 %.  Physical Exam: General: Alert and awake, oriented x3, not in any acute distress. CVS: S1-S2 clear, no murmur rubs or gallops ChestDecreased breath sounds throughout Abdomen : soft nontender, nondistended, normal bowel sounds  Extremities: no cyanosis, clubbing, 1+ edema noted bilaterally Neuro: Cranial nerves II-XII intact, no focal neurological deficits  Lab Results: Basic Metabolic Panel:  Recent Labs Lab 12/26/14 1045  NA 134*  K 4.4  CL 100  CO2 31  GLUCOSE 120*  BUN 19  CREATININE 1.17  CALCIUM 9.7   Liver Function Tests: No results for input(s): AST, ALT, ALKPHOS, BILITOT, PROT, ALBUMIN in the last  168 hours. No results for input(s): LIPASE, AMYLASE in the last 168 hours. No results for input(s): AMMONIA in the last 168 hours. CBC:  Recent Labs Lab 12/26/14 1045  WBC 6.8  HGB 12.8*  HCT 39.5  MCV 91.4  PLT 164   Cardiac Enzymes: No  results for input(s): CKTOTAL, CKMB, CKMBINDEX, TROPONINI in the last 168 hours. BNP: Invalid input(s): POCBNP CBG:  Recent Labs Lab 12/26/14 1857 12/26/14 2146 12/27/14 0336 12/27/14 0549  GLUCAP 331* 324* 246* 258*     Micro Results: No results found for this or any previous visit (from the past 240 hour(s)).  Studies/Results: Dg Chest 2 View  12/26/2014   CLINICAL DATA:  Shortness of breath for 3 days. History of lung cancer.  EXAM: CHEST  2 VIEW  COMPARISON:  Chest x-ray 03/22/2011  FINDINGS: A permanent right-sided pacemaker is noted with a single right ventricular wire. The cardiac silhouette, mediastinal and hilar contours are grossly stable. There are stable surgical changes involving the left hemi thorax along with near complete opacification of left upper hemi thorax which is new. This could be radiation change but could not exclude recurrent tumor. There is significant traction and elevation of the left hemidiaphragm which is most likely radiation related. The right lung demonstrates stable pulmonary nodules. No pleural effusion. The bony thorax is grossly intact. Stable rib resections from a prior thoracotomy.  IMPRESSION: Surgical changes and probable extensive radiation changes involving the left hemithorax. Any more recent prior chest x-rays would be helpful for comparison as I could not exclude recurrent tumor in the left upper lung.  Stable right-sided pulmonary nodules.   Electronically Signed   By: Kalman Jewels M.D.   On: 12/26/2014 12:13   Ct Angio Chest Pe W/cm &/or Wo Cm  12/26/2014   CLINICAL DATA:  Chronic shortness of breath which has progressively worsened over the past 3-4 days, now with productive cough.  EXAM: CT ANGIOGRAPHY CHEST WITH CONTRAST  TECHNIQUE: Multidetector CT imaging of the chest was performed using the standard protocol during bolus administration of intravenous contrast. Multiplanar CT image reconstructions and MIPs were obtained to evaluate the  vascular anatomy.  CONTRAST:  132mL OMNIPAQUE IOHEXOL 350 MG/ML IV.  COMPARISON:  CTA chest 03/27/2011, 03/22/2011.  FINDINGS: Contrast opacification of the pulmonary arteries is good in the right lung but only fair in the left lung. Respiratory motion blurred many images.  No filling defects within either main pulmonary artery or their visualized branches in either lung. Heart markedly enlarged with left ventricular enlargement, right ventricular hypertrophy, and right atrial enlargement. Small to moderate-sized pericardial effusion in the superior recess. Reflux of contrast from the right atrium into the IVC and the central hepatic veins. Severe 3 vessel coronary atherosclerosis. Moderate aortic valvular calcification. Pacemaker lead tip at the RV apex. Severe atherosclerosis involving the thoracic and upper abdominal aorta. Penetrating ulcer involving the distal aortic arch measuring approximately 1.7 cm at its base with a depth of approximately 1.4 mm, not significantly changed from the prior examinations.  Prior left upper lobectomy. Interval development of consolidation throughout the upper portion of the remaining left lung. Marked bronchial wall thickening involving left lower lobe segmental bronchi, associated with patchy airspace opacities at the left lung base. Moderately large right pleural effusion and associated mild passive atelectasis in the right lower lobe. Approximate 6 mm nodule in the inferior right upper lobe, minimally increased in size since 2012. Stable pleuroparenchymal scarring with associated calcification and bronchiectasis in the right apex.  Calcified scar in the left apex, also stable. Emphysematous changes in the right lung apex. Filling defect in the left mainstem bronchus. Central airways otherwise patent. Moderate to marked bronchial wall thickening centrally in the right lung.  Enlarged subcarinal lymph nodes measuring approximately 1.7 x 4.0 cm, unchanged. No new or enlarging  lymphadenopathy. Collateral vessels in the anterior mediastinum, unchanged. Stable asymmetric enlargement of the right lobe of the thyroid gland without significant nodularity.  Low-attenuation left adrenal mass measuring approximately 2.8 x 1.4 cm, unchanged. Remaining visualized upper abdomen unremarkable. Bone window images demonstrate minimal-to-mild mid and lower thoracic spondylosis.  Review of the MIP images confirms the above findings.  IMPRESSION: 1. No evidence of pulmonary embolism. 2. Stable penetrating ulcer arising from the distal aortic arch dating back to 2012. 3. Prior left upper lobectomy. Dense consolidation throughout the upper portion of the remaining left lung with associated air bronchograms and patchy opacity in the base of the remaining left lung likely represents pneumonia and/or post radiation changes. However, follow-up CT in 1-2 months after treatment is recommended to exclude underlying recurrent tumor. 4. Filling defect in the left mainstem bronchus likely a mucous plug. 5. Moderate size right pleural effusion. 6. Cardiomegaly with left ventricular enlargement, right ventricular hypertrophy, left atrial enlargement. Severe 3 vessel coronary atherosclerosis. Moderate-sized pericardial effusion. 7. Stable subcarinal mediastinal lymphadenopathy dating back to 2012, therefore likely reactive. 8. Stable left adrenal mass dating back 2012, therefore benign adenoma.   Electronically Signed   By: Evangeline Dakin M.D.   On: 12/26/2014 15:08    Medications: Scheduled Meds: . atorvastatin  40 mg Oral QHS  . azithromycin  500 mg Oral Q24H  . carbamide peroxide  5-10 drop Both Ears BID  . carvedilol  12.5 mg Oral BID WC  . cefTRIAXone (ROCEPHIN)  IV  1 g Intravenous Q24H  . enoxaparin (LOVENOX) injection  40 mg Subcutaneous Q24H  . ferrous sulfate  325 mg Oral Q breakfast  . furosemide  40 mg Intravenous Q12H  . gabapentin  300 mg Oral QHS  . insulin glargine  30 Units Subcutaneous  QHS  . pantoprazole  40 mg Oral Daily  . predniSONE  40 mg Oral Q breakfast  . sodium chloride  3 mL Intravenous Q12H  . tiotropium  18 mcg Inhalation Daily      LOS: 1 day   RAI,RIPUDEEP M.D. [facility] 12/27/2014, 11:54 AM Pager: [phone]  If 7PM-7AM, please contact night-coverage [email or web address] Password [email or web address]

## 2014-12-27 NOTE — Progress Notes (Signed)
  Echocardiogram 2D Echocardiogram has been performed.  John Gilbert 12/27/2014, 10:55 AM

## 2014-12-27 NOTE — Consult Note (Signed)
Reason for Consult: Congestive heart failure, coronary artery disease, possible left ventricular thrombus  Requesting Physician: Rai  Cardiologist: [facility]  HPI: John Gilbert was admitted for shortness of breath on exertion, felt to be secondary to congestive heart failure with an excellent response to diuretic therapy. He believes he is back to baseline and wants to return home. He receives all of his care at the [facility] in [place].  This is a 81 y.o. male with a past medical history significant for long-standing coronary artery disease (declined bypass surgery in the past) severe ischemic cardiomyopathy, status post defibrillator implantation, chronic systolic heart failure, history of lung cancer status post lobectomy/radiation/chemotherapy, insulin requiring diabetes mellitus admitted with 3-4 days of worsening dyspnea on exertion that responded promptly to treatment with diuretics. Workup also shows extensive radiation scarring in the left chest and a worrisome filling defect in the left mainstem bronchus felt to possibly be a mucous plug. A "moderate sized" pericardial effusion was described.  Echocardiography did not confirm the reported pericardial effusion. It shows severe cardiomyopathy with an ejection fraction of about 25% with extensive wall motion abnormality suggesting scar in the distribution of the right coronary artery and distal LAD artery as well as ischemic-mechanism moderate mitral insufficiency (posterior leaflet tethering). There is a likely inferoapical small left ventricular thrombus that appears mural, flat and very fixed. It has the appearance of a chronic clot. He does not have a history of embolic events.  Although no old echo report is available in our electronic medical record, are echo archiving system shows images from 2012 that showed very similar wall motion abnormalities and left ventricular apical filling defect suggesting that the clot, if  present is definitely old.  His Doppler parameters to suggest persistent elevated filling pressures (pseudo-normal pattern) but this may be a chronic situation for him.  PMHx:  Past Medical History  Diagnosis Date  . Diabetes mellitus   . Hypertension   . CHF (congestive heart failure)   . Cancer    Past Surgical History  Procedure Laterality Date  . Lung surgery    . Back surgery      FAMHx: No family history on file.  SOCHx:  reports that he has quit smoking. He does not have any smokeless tobacco history on file. He reports that he does not drink alcohol. His drug history is not on file.  ALLERGIES: Allergies  Allergen Reactions  . Lisinopril Swelling    Tongue swelled (2 months)  . Morphine And Related     Messes with his mind/head    ROS: Constitutional: Denies fever, chills, diaphoresis, appetite change and fatigue.  HEENT: Denies visual or hearing symptoms, congestion, sore throat, difficulty swallowing or neck pain Respiratory: SOB, DOE, cough, denies chest tightness, and wheezing.  Cardiovascular: Denies chest pain, palpitations , leg swelling+.  Gastrointestinal: Denies nausea, vomiting, abdominal pain, diarrhea, constipation, blood in stool and abdominal distention.  Genitourinary: Denies dysuria, hematuria, flank pain and difficulty urinating.  Endocrine: Denies: hot or cold intolerance, polyuria, polydipsia. Musculoskeletal: Denies myalgias, back pain, joint pain Skin: Denies , rash and wound.  Neurological: Denies dizziness, , syncope, weakness, light-headedness, numbness and headaches.  Hematological: Denies adenopathy.  Psychiatric/Behavioral: Denies confusion. Spends a large part of his day sleeping  HOME MEDICATIONS: Prescriptions prior to admission  Medication Sig Dispense Refill Last Dose  . albuterol (PROVENTIL) (2.5 MG/3ML) 0.083% nebulizer solution Take 2.5 mg by nebulization daily as needed for wheezing or shortness of breath.  12/26/2014  at Unknown time  . atorvastatin (LIPITOR) 80 MG tablet Take 40 mg by mouth at bedtime.   12/25/2014 at Unknown time  . carbamide peroxide (DEBROX) 6.5 % otic solution Place 5-10 drops into both ears 2 (two) times daily.   12/24/2014  . carvedilol (COREG) 25 MG tablet Take 12.5 mg by mouth 2 (two) times daily with a meal.   12/25/2014 at 2115  . ferrous sulfate 325 (65 FE) MG tablet Take 325 mg by mouth daily with breakfast.   12/25/2014 at Unknown time  . furosemide (LASIX) 40 MG tablet Take 40 mg by mouth 2 (two) times daily.   12/25/2014 at Unknown time  . gabapentin (NEURONTIN) 300 MG capsule Take 300 mg by mouth at bedtime.   12/25/2014 at Unknown time  . insulin aspart (NOVOLOG) 100 UNIT/ML injection Inject 10 Units into the skin 3 (three) times daily before meals.   12/25/2014 at Unknown time  . insulin glargine (LANTUS) 100 UNIT/ML injection Inject 30 Units into the skin at bedtime.   12/25/2014 at Unknown time  . omeprazole (PRILOSEC) 20 MG capsule Take 20 mg by mouth daily.   12/25/2014 at Unknown time  . tiotropium (SPIRIVA) 18 MCG inhalation capsule Place 18 mcg into inhaler and inhale daily.   12/25/2014 at Unknown time    HOSPITAL MEDICATIONS: Prior to Admission:  Prescriptions prior to admission  Medication Sig Dispense Refill Last Dose  . albuterol (PROVENTIL) (2.5 MG/3ML) 0.083% nebulizer solution Take 2.5 mg by nebulization daily as needed for wheezing or shortness of breath.   12/26/2014 at Unknown time  . atorvastatin (LIPITOR) 80 MG tablet Take 40 mg by mouth at bedtime.   12/25/2014 at Unknown time  . carbamide peroxide (DEBROX) 6.5 % otic solution Place 5-10 drops into both ears 2 (two) times daily.   12/24/2014  . carvedilol (COREG) 25 MG tablet Take 12.5 mg by mouth 2 (two) times daily with a meal.   12/25/2014 at 2115  . ferrous sulfate 325 (65 FE) MG tablet Take 325 mg by mouth daily with breakfast.   12/25/2014 at Unknown time  . furosemide (LASIX) 40 MG tablet Take 40 mg by mouth 2  (two) times daily.   12/25/2014 at Unknown time  . gabapentin (NEURONTIN) 300 MG capsule Take 300 mg by mouth at bedtime.   12/25/2014 at Unknown time  . insulin aspart (NOVOLOG) 100 UNIT/ML injection Inject 10 Units into the skin 3 (three) times daily before meals.   12/25/2014 at Unknown time  . insulin glargine (LANTUS) 100 UNIT/ML injection Inject 30 Units into the skin at bedtime.   12/25/2014 at Unknown time  . omeprazole (PRILOSEC) 20 MG capsule Take 20 mg by mouth daily.   12/25/2014 at Unknown time  . tiotropium (SPIRIVA) 18 MCG inhalation capsule Place 18 mcg into inhaler and inhale daily.   12/25/2014 at Unknown time   Scheduled: . aspirin EC  325 mg Oral Daily  . atorvastatin  40 mg Oral QHS  . azithromycin  500 mg Oral Q24H  . carbamide peroxide  5-10 drop Both Ears BID  . carvedilol  12.5 mg Oral BID WC  . cefTRIAXone (ROCEPHIN)  IV  1 g Intravenous Q24H  . enoxaparin (LOVENOX) injection  40 mg Subcutaneous Q24H  . ferrous sulfate  325 mg Oral Q breakfast  . furosemide  60 mg Oral Daily  . gabapentin  300 mg Oral QHS  . insulin aspart  0-15 Units Subcutaneous TID WC  .  insulin aspart  0-5 Units Subcutaneous QHS  . insulin glargine  35 Units Subcutaneous QHS  . pantoprazole  40 mg Oral Daily  . predniSONE  40 mg Oral Q breakfast  . sodium chloride  3 mL Intravenous Q12H  . tiotropium  18 mcg Inhalation Daily   Continuous:   VITALS: Blood pressure 123/78, pulse 76, temperature 98.3 F (36.8 C), temperature source Oral, resp. rate 18, height 6' 3.25" (1.911 m), weight 198 lb 12.8 oz (90.175 kg), SpO2 98 %.  PHYSICAL EXAM:  General: Alert, oriented x3, no distress Head: no evidence of trauma, PERRL, EOMI, no exophtalmos or lid lag, no myxedema, no xanthelasma; normal ears, nose and oropharynx Neck: 3-4 cm elevation jugular venous pulsations and no hepatojugular reflux; brisk carotid pulses without delay and no carotid bruits Chest: clear to auscultation, no signs of  consolidation by percussion or palpation, normal fremitus, symmetrical and full respiratory excursions Cardiovascular: normal position and quality of the apical impulse, regular rhythm, normal first heart sound and normal second heart sound, no rubs or gallops, 1/6 apical holosystolic murmur Abdomen: no tenderness or distention, no masses by palpation, no abnormal pulsatility or arterial bruits, normal bowel sounds, no hepatosplenomegaly Extremities: no clubbing, cyanosis;  no edema; 2+ radial, ulnar and brachial pulses bilaterally; 2+ right femoral, posterior tibial and dorsalis pedis pulses; 2+ left femoral, posterior tibial and dorsalis pedis pulses; no subclavian or femoral bruits Neurological: grossly nonfocal   LABS  CBC  Recent Labs  12/26/14 1045  WBC 6.8  HGB 12.8*  HCT 39.5  MCV 91.4  PLT 665   Basic Metabolic Panel  Recent Labs  12/26/14 1045  NA 134*  K 4.4  CL 100  CO2 31  GLUCOSE 120*  BUN 19  CREATININE 1.17  CALCIUM 9.7   Liver Function Tests No results for input(s): AST, ALT, ALKPHOS, BILITOT, PROT, ALBUMIN in the last 72 hours. No results for input(s): LIPASE, AMYLASE in the last 72 hours. Cardiac Enzymes No results for input(s): CKTOTAL, CKMB, CKMBINDEX, TROPONINI in the last 72 hours. BNP Invalid input(s): POCBNP D-Dimer No results for input(s): DDIMER in the last 72 hours. Hemoglobin A1C No results for input(s): HGBA1C in the last 72 hours. Fasting Lipid Panel No results for input(s): CHOL, HDL, LDLCALC, TRIG, CHOLHDL, LDLDIRECT in the last 72 hours. Thyroid Function Tests No results for input(s): TSH, T4TOTAL, T3FREE, THYROIDAB in the last 72 hours.  Invalid input(s): FREET3    IMAGING: Dg Chest 2 View  12/26/2014   CLINICAL DATA:  Shortness of breath for 3 days. History of lung cancer.  EXAM: CHEST  2 VIEW  COMPARISON:  Chest x-ray 03/22/2011  FINDINGS: A permanent right-sided pacemaker is noted with a single right ventricular wire. The  cardiac silhouette, mediastinal and hilar contours are grossly stable. There are stable surgical changes involving the left hemi thorax along with near complete opacification of left upper hemi thorax which is new. This could be radiation change but could not exclude recurrent tumor. There is significant traction and elevation of the left hemidiaphragm which is most likely radiation related. The right lung demonstrates stable pulmonary nodules. No pleural effusion. The bony thorax is grossly intact. Stable rib resections from a prior thoracotomy.  IMPRESSION: Surgical changes and probable extensive radiation changes involving the left hemithorax. Any more recent prior chest x-rays would be helpful for comparison as I could not exclude recurrent tumor in the left upper lung.  Stable right-sided pulmonary nodules.   Electronically Signed  By: Kalman Jewels M.D.   On: 12/26/2014 12:13   Ct Angio Chest Pe W/cm &/or Wo Cm  12/26/2014   CLINICAL DATA:  Chronic shortness of breath which has progressively worsened over the past 3-4 days, now with productive cough.  EXAM: CT ANGIOGRAPHY CHEST WITH CONTRAST  TECHNIQUE: Multidetector CT imaging of the chest was performed using the standard protocol during bolus administration of intravenous contrast. Multiplanar CT image reconstructions and MIPs were obtained to evaluate the vascular anatomy.  CONTRAST:  182mL OMNIPAQUE IOHEXOL 350 MG/ML IV.  COMPARISON:  CTA chest 03/27/2011, 03/22/2011.  FINDINGS: Contrast opacification of the pulmonary arteries is good in the right lung but only fair in the left lung. Respiratory motion blurred many images.  No filling defects within either main pulmonary artery or their visualized branches in either lung. Heart markedly enlarged with left ventricular enlargement, right ventricular hypertrophy, and right atrial enlargement. Small to moderate-sized pericardial effusion in the superior recess. Reflux of contrast from the right atrium  into the IVC and the central hepatic veins. Severe 3 vessel coronary atherosclerosis. Moderate aortic valvular calcification. Pacemaker lead tip at the RV apex. Severe atherosclerosis involving the thoracic and upper abdominal aorta. Penetrating ulcer involving the distal aortic arch measuring approximately 1.7 cm at its base with a depth of approximately 1.4 mm, not significantly changed from the prior examinations.  Prior left upper lobectomy. Interval development of consolidation throughout the upper portion of the remaining left lung. Marked bronchial wall thickening involving left lower lobe segmental bronchi, associated with patchy airspace opacities at the left lung base. Moderately large right pleural effusion and associated mild passive atelectasis in the right lower lobe. Approximate 6 mm nodule in the inferior right upper lobe, minimally increased in size since 2012. Stable pleuroparenchymal scarring with associated calcification and bronchiectasis in the right apex. Calcified scar in the left apex, also stable. Emphysematous changes in the right lung apex. Filling defect in the left mainstem bronchus. Central airways otherwise patent. Moderate to marked bronchial wall thickening centrally in the right lung.  Enlarged subcarinal lymph nodes measuring approximately 1.7 x 4.0 cm, unchanged. No new or enlarging lymphadenopathy. Collateral vessels in the anterior mediastinum, unchanged. Stable asymmetric enlargement of the right lobe of the thyroid gland without significant nodularity.  Low-attenuation left adrenal mass measuring approximately 2.8 x 1.4 cm, unchanged. Remaining visualized upper abdomen unremarkable. Bone window images demonstrate minimal-to-mild mid and lower thoracic spondylosis.  Review of the MIP images confirms the above findings.  IMPRESSION: 1. No evidence of pulmonary embolism. 2. Stable penetrating ulcer arising from the distal aortic arch dating back to 2012. 3. Prior left upper  lobectomy. Dense consolidation throughout the upper portion of the remaining left lung with associated air bronchograms and patchy opacity in the base of the remaining left lung likely represents pneumonia and/or post radiation changes. However, follow-up CT in 1-2 months after treatment is recommended to exclude underlying recurrent tumor. 4. Filling defect in the left mainstem bronchus likely a mucous plug. 5. Moderate size right pleural effusion. 6. Cardiomegaly with left ventricular enlargement, right ventricular hypertrophy, left atrial enlargement. Severe 3 vessel coronary atherosclerosis. Moderate-sized pericardial effusion. 7. Stable subcarinal mediastinal lymphadenopathy dating back to 2012, therefore likely reactive. 8. Stable left adrenal mass dating back 2012, therefore benign adenoma.   Electronically Signed   By: Evangeline Dakin M.D.   On: 12/26/2014 15:08    ECG: NSR, old inferior infarction, lateral T-wave inversion very similar to 2012 tracing  TELEMETRY:  no  significant arrhythmia  IMPRESSION: 1. Acute on chronic combined systolic and diastolic heart failure with good clinical response to diuretic therapy. 2. Severe chronic ischemic cardiomyopathy 3. Moderate mitral insufficiency, likely ischemic mechanism 4. Possible LV apical thrombus, chronic 5. Status post ICD  RECOMMENDATION: 1. Slightly increased chronic diuretic therapy (60 mg BID), continue sodium restriction and careful daily weight monitoring. Call to promptly report weight gain of more than 3 pounds over discharge weight. He is already on high-dose carvedilol therapy. He is allergic to ace inhibitors. 2. He believes that he has follow-up scheduled at the [facility] in short order. I recommend that he be seen in the next 2 weeks. If this is not achievable, we will gladly accommodate him in our practice. 3. I do not recommend anticoagulation at this point.  Time Spent Directly with Patient: 58 minutes  Sanda Klein, MD, [facility] [phone] office [phone] pager   12/27/2014, 2:41 PM

## 2014-12-29 LAB — HEMOGLOBIN A1C
Hgb A1c MFr Bld: 9.8 % — ABNORMAL HIGH (ref 4.8–5.6)
Hgb A1c MFr Bld: 11 % — ABNORMAL HIGH (ref 4.8–5.6)
Mean Plasma Glucose: 235 mg/dL
Mean Plasma Glucose: 269 mg/dL

## 2014-12-29 LAB — LEGIONELLA ANTIGEN, URINE

## 2014-12-30 LAB — HIV ANTIBODY (ROUTINE TESTING W REFLEX): HIV SCREEN 4TH GENERATION: NONREACTIVE

## 2014-12-30 NOTE — Progress Notes (Signed)
UR complete.  Courtany Mcmurphy RN, MSN 

## 2015-01-02 LAB — CULTURE, BLOOD (ROUTINE X 2)
Culture: NO GROWTH
Culture: NO GROWTH

## 2015-10-15 IMAGING — CT CT ANGIO CHEST
2 of 10 series · 17 of 46 positions shown · IV contrast (Omni 300)
Comparison: CTA chest 03/27/2011, 03/22/2011.

CLINICAL DATA: Chronic shortness of breath which has progressively
worsened over the past 3-4 days, now with productive cough.

EXAM:
CT ANGIOGRAPHY CHEST WITH CONTRAST
TECHNIQUE: Multidetector CT imaging of the chest was performed using the
standard protocol during bolus administration of intravenous
contrast. Multiplanar CT image reconstructions and MIPs were
obtained to evaluate the vascular anatomy.
CONTRAST:  100mL OMNIPAQUE IOHEXOL 350 MG/ML IV.

[Series 7: thins · axial · 0.71mm/px · z∈[+1127,+1435]mm · 14 of 342 slices shown]
[im 17/342  lung]
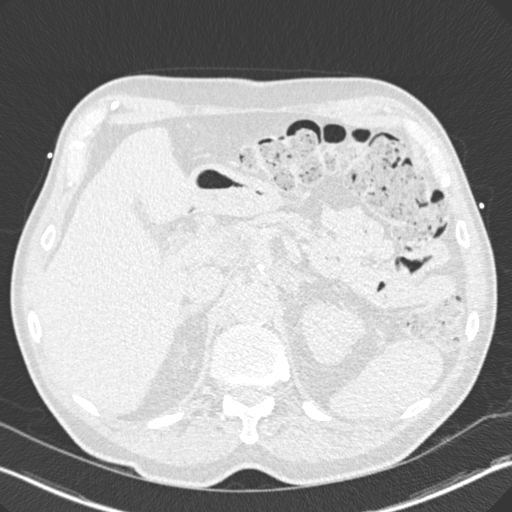
[im 49/342  soft-tissue]
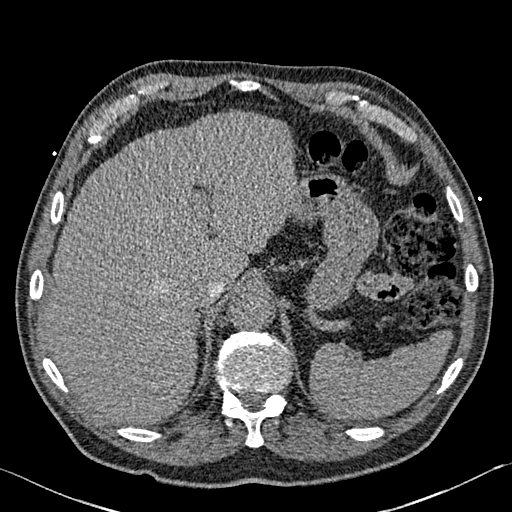
[im 65/342  lung]
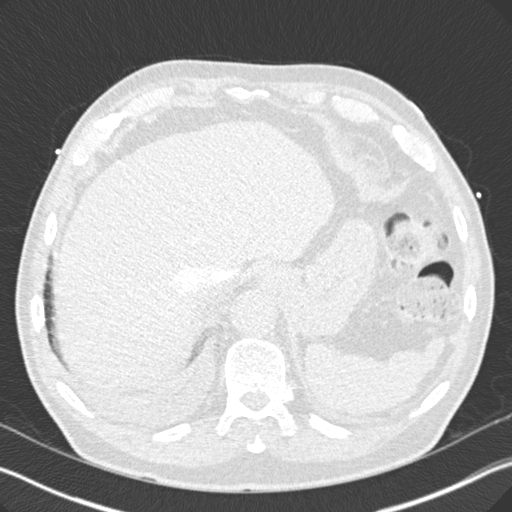
[im 98/342  soft-tissue]
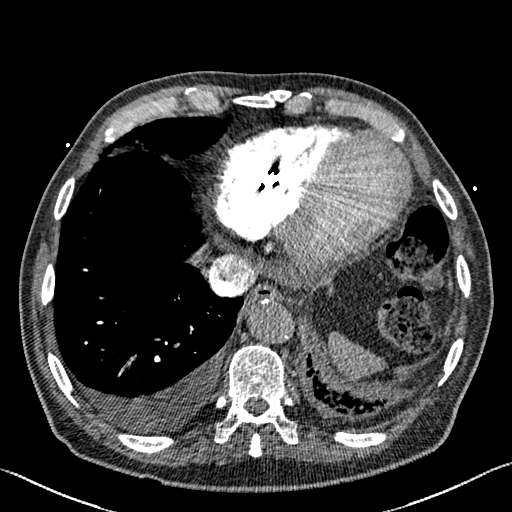
[im 114/342  lung]
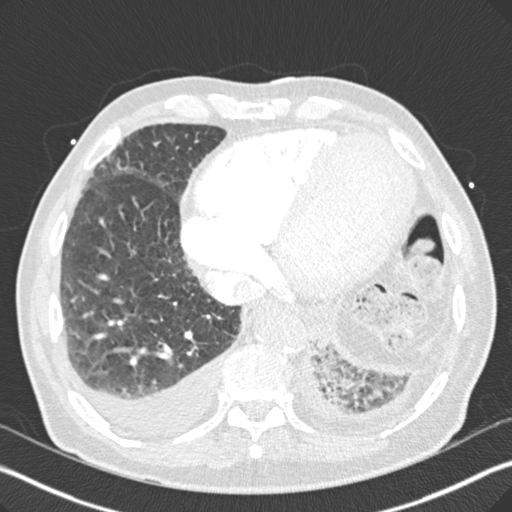
[im 130/342  soft-tissue]
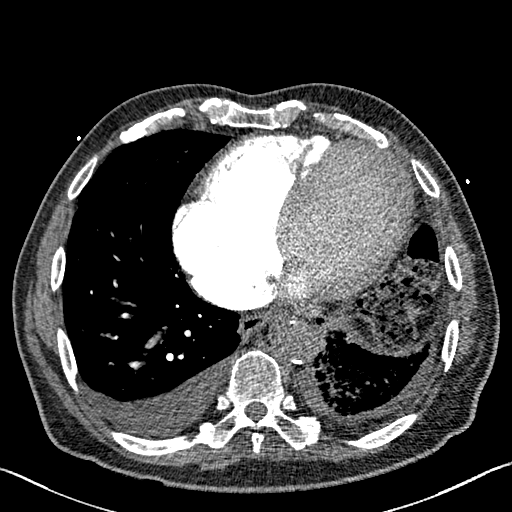
[im 163/342  lung]
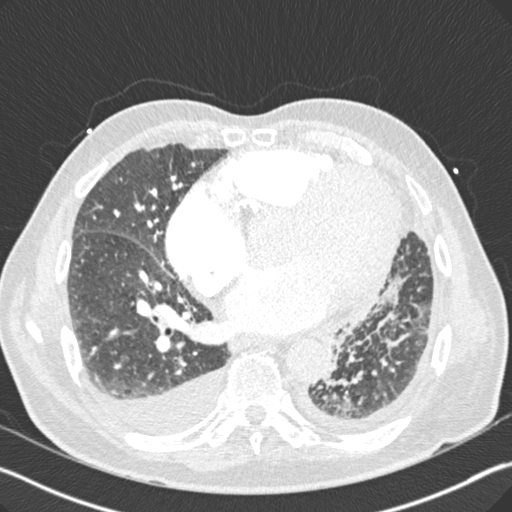
[im 179/342  soft-tissue]
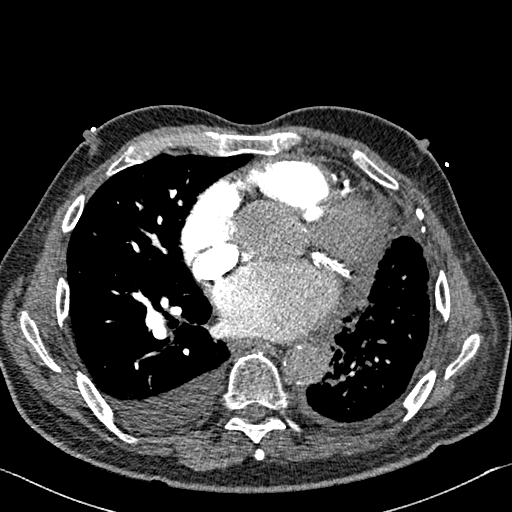
[im 212/342  lung]
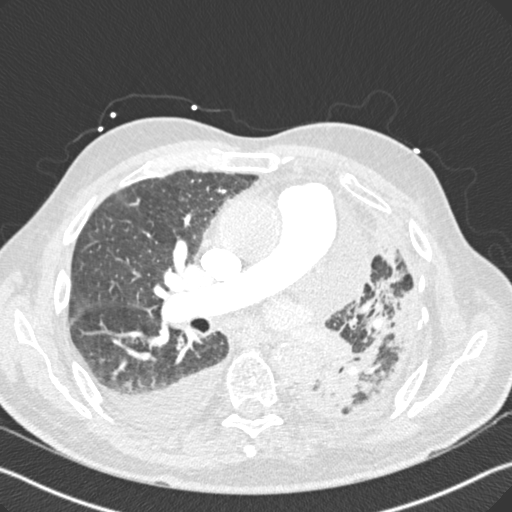
[im 228/342  soft-tissue]
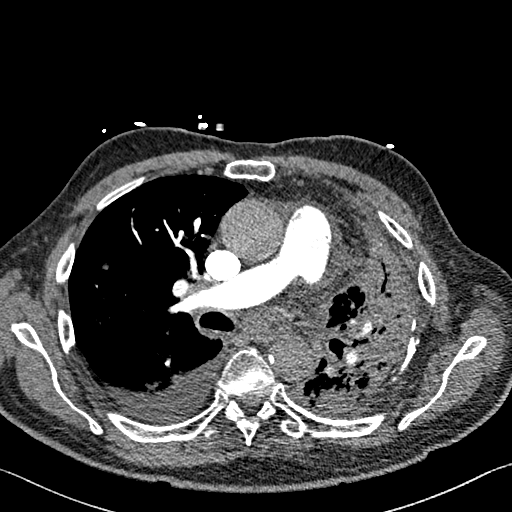
[im 260/342  lung]
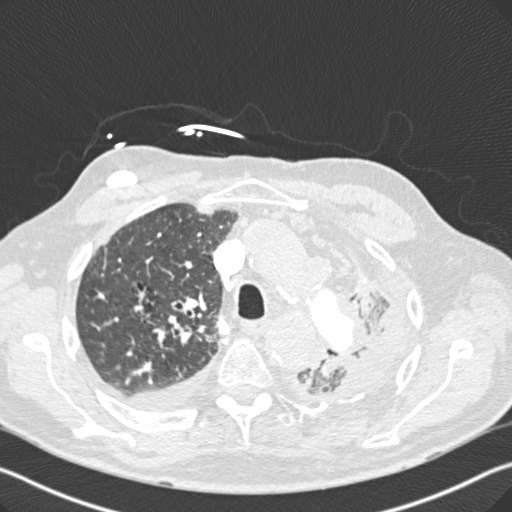
[im 277/342  soft-tissue]
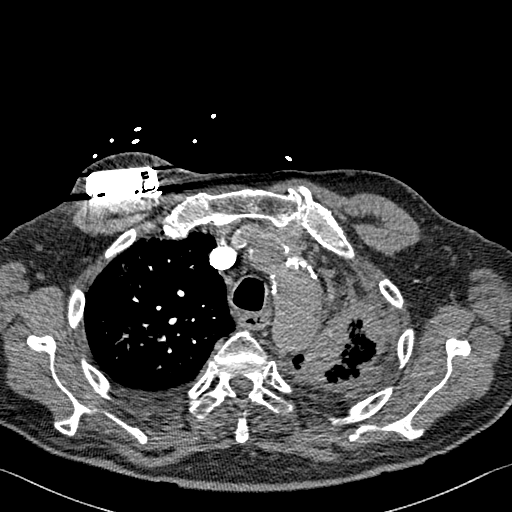
[im 293/342  lung]
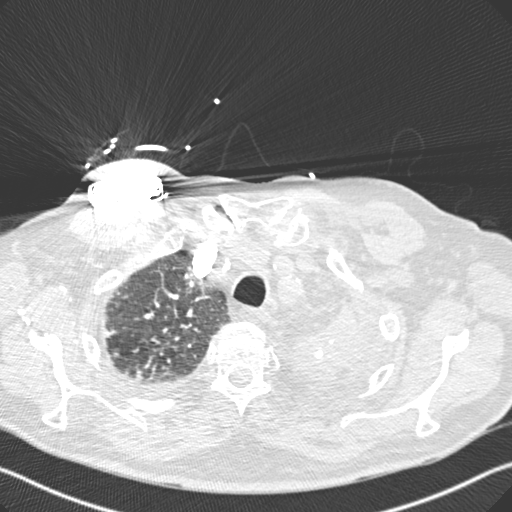
[im 325/342  soft-tissue]
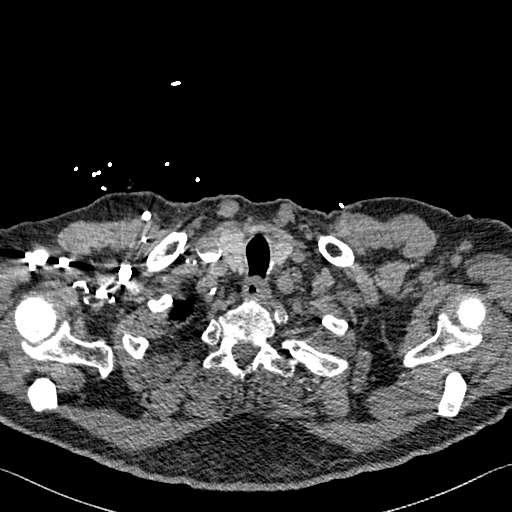

[Series 9: coronal mpr · coronal · 0.71mm/px · 3 of 151 slices shown]
[im 38/151  soft-tissue]
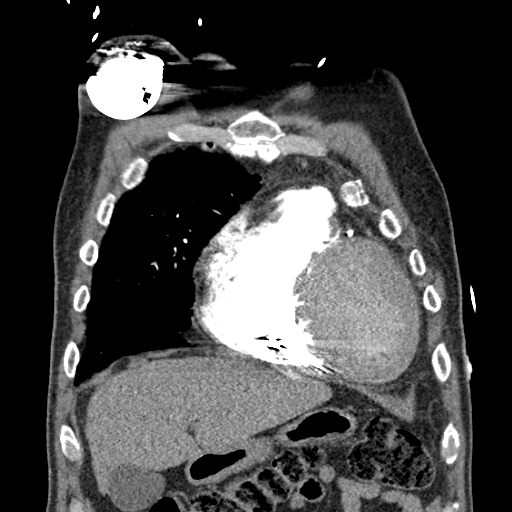
[im 76/151  soft-tissue]
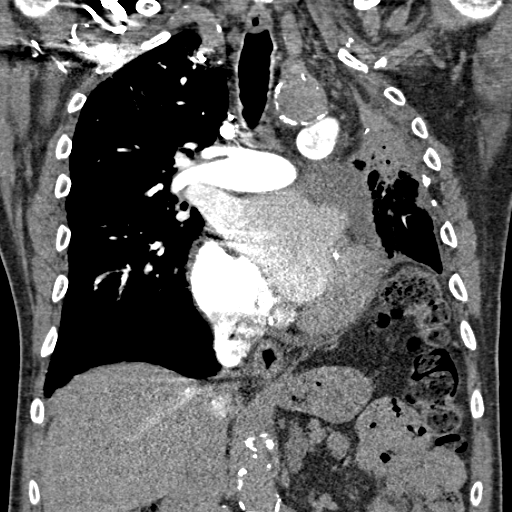
[im 113/151  soft-tissue]
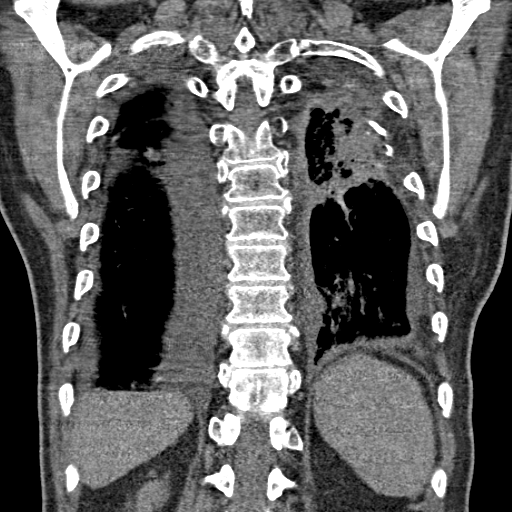

[17 of 46 positions shown; findings below may reference images not displayed]

FINDINGS: Contrast opacification of the pulmonary arteries is good in the
right lung but only fair in the left lung. Respiratory motion
blurred many images.

No filling defects within either main pulmonary artery or their
visualized branches in either lung. Heart markedly enlarged with
left ventricular enlargement, right ventricular hypertrophy, and
right atrial enlargement. Small to moderate-sized pericardial
effusion in the superior recess. Reflux of contrast from the right
atrium into the IVC and the central hepatic veins. Severe 3 vessel
coronary atherosclerosis. Moderate aortic valvular calcification.
Pacemaker lead tip at the RV apex. Severe atherosclerosis involving
the thoracic and upper abdominal aorta. Penetrating ulcer involving
the distal aortic arch measuring approximately 1.7 cm at its base
with a depth of approximately 1.4 mm, not significantly changed from
the prior examinations.

Prior left upper lobectomy. Interval development of consolidation
throughout the upper portion of the remaining left lung. Marked
bronchial wall thickening involving left lower lobe segmental
bronchi, associated with patchy airspace opacities at the left lung
base. Moderately large right pleural effusion and associated mild
passive atelectasis in the right lower lobe. Approximate 6 mm nodule
in the inferior right upper lobe, minimally increased in size since
5355. Stable pleuroparenchymal scarring with associated
calcification and bronchiectasis in the right apex. Calcified scar
in the left apex, also stable. Emphysematous changes in the right
lung apex. Filling defect in the left mainstem bronchus. Central
airways otherwise patent. Moderate to marked bronchial wall
thickening centrally in the right lung.

Enlarged subcarinal lymph nodes measuring approximately 1.7 x
cm, unchanged. No new or enlarging lymphadenopathy. Collateral
vessels in the anterior mediastinum, unchanged. Stable asymmetric
enlargement of the right lobe of the thyroid gland without
significant nodularity.

Low-attenuation left adrenal mass measuring approximately 2.8 x
cm, unchanged. Remaining visualized upper abdomen unremarkable. Bone
window images demonstrate minimal-to-mild mid and lower thoracic
spondylosis.

Review of the MIP images confirms the above findings.
IMPRESSION: 1. No evidence of pulmonary embolism.
2. Stable penetrating ulcer arising from the distal aortic arch
dating back to 5355.
3. Prior left upper lobectomy. Dense consolidation throughout the
upper portion of the remaining left lung with associated air
bronchograms and patchy opacity in the base of the remaining left
lung likely represents pneumonia and/or post radiation changes.
However, follow-up CT in 1-2 months after treatment is recommended
to exclude underlying recurrent tumor.
4. Filling defect in the left mainstem bronchus likely a mucous
plug.
5. Moderate size right pleural effusion.
6. Cardiomegaly with left ventricular enlargement, right ventricular
hypertrophy, left atrial enlargement. Severe 3 vessel coronary
atherosclerosis. Moderate-sized pericardial effusion.
7. Stable subcarinal mediastinal lymphadenopathy dating back to
5355, therefore likely reactive.
8. Stable left adrenal mass dating back 5355, therefore benign
adenoma.

## 2015-12-30 DEATH — deceased
